# Patient Record
Sex: Female | Born: 1975 | Race: White | Hispanic: No | Marital: Married | Smoking: Never smoker
Health system: Southern US, Community
[De-identification: ages and names within clinical notes are randomized; demographics above are authoritative.]

## PROBLEM LIST (undated history)

## (undated) DIAGNOSIS — F439 Reaction to severe stress, unspecified: Secondary | ICD-10-CM

## (undated) DIAGNOSIS — T7840XA Allergy, unspecified, initial encounter: Secondary | ICD-10-CM

## (undated) DIAGNOSIS — R0602 Shortness of breath: Secondary | ICD-10-CM

## (undated) DIAGNOSIS — E039 Hypothyroidism, unspecified: Secondary | ICD-10-CM

## (undated) DIAGNOSIS — R569 Unspecified convulsions: Secondary | ICD-10-CM

## (undated) DIAGNOSIS — E785 Hyperlipidemia, unspecified: Secondary | ICD-10-CM

## (undated) DIAGNOSIS — G40909 Epilepsy, unspecified, not intractable, without status epilepticus: Secondary | ICD-10-CM

## (undated) DIAGNOSIS — E663 Overweight: Secondary | ICD-10-CM

## (undated) HISTORY — DX: Epilepsy, unspecified, not intractable, without status epilepticus: G40.909

## (undated) HISTORY — PX: OTHER SURGICAL HISTORY: SHX169

## (undated) HISTORY — DX: Hyperlipidemia, unspecified: E78.5

## (undated) HISTORY — DX: Shortness of breath: R06.02

## (undated) HISTORY — DX: Reaction to severe stress, unspecified: F43.9

## (undated) HISTORY — DX: Overweight: E66.3

## (undated) HISTORY — PX: WRIST SURGERY: SHX841

## (undated) HISTORY — DX: Hypothyroidism, unspecified: E03.9

## (undated) HISTORY — PX: KNEE SURGERY: SHX244

## (undated) HISTORY — DX: Unspecified convulsions: R56.9

## (undated) HISTORY — DX: Allergy, unspecified, initial encounter: T78.40XA

---

## 2005-12-31 ENCOUNTER — Ambulatory Visit: Payer: Self-pay | Admitting: Cardiology

## 2007-11-15 ENCOUNTER — Encounter (INDEPENDENT_AMBULATORY_CARE_PROVIDER_SITE_OTHER): Payer: Self-pay | Admitting: Orthopedic Surgery

## 2007-11-15 ENCOUNTER — Ambulatory Visit (HOSPITAL_BASED_OUTPATIENT_CLINIC_OR_DEPARTMENT_OTHER): Admission: RE | Admit: 2007-11-15 | Discharge: 2007-11-15 | Payer: Self-pay | Admitting: Orthopedic Surgery

## 2009-12-23 ENCOUNTER — Encounter: Payer: Self-pay | Admitting: Cardiology

## 2010-07-01 ENCOUNTER — Encounter: Payer: Self-pay | Admitting: Cardiology

## 2010-07-01 DIAGNOSIS — E039 Hypothyroidism, unspecified: Secondary | ICD-10-CM | POA: Insufficient documentation

## 2010-07-01 DIAGNOSIS — E785 Hyperlipidemia, unspecified: Secondary | ICD-10-CM | POA: Insufficient documentation

## 2010-07-02 ENCOUNTER — Ambulatory Visit
Admission: RE | Admit: 2010-07-02 | Discharge: 2010-07-02 | Payer: Self-pay | Source: Home / Self Care | Attending: Cardiology | Admitting: Cardiology

## 2010-07-02 ENCOUNTER — Encounter: Payer: Self-pay | Admitting: Cardiology

## 2010-07-10 NOTE — Assessment & Plan Note (Signed)
Summary: EST LAST 2007 -   Visit Type:  Follow-up Primary Provider:  Allyson Sabal  CC:  palpitations and SOB.  History of Present Illness: The patient was seen for the evaluation of palpitations and shortness of breath.  I had seen her in 2007.  At that time I thought that her palpitations were very benign.  I decided not to do an echo or a Holter monitor.  She has been relatively stable.  In the past 3 years she's developed seizures that are being treated.  The patient recently has noted that when she becomes upset she has palpitations and significant shortness of breath.  She does not think that this is a panic attack.  She is here for further evaluation.  She has not had syncope or presyncope.  Preventive Screening-Counseling & Management  Alcohol-Tobacco     Smoking Status: never  Current Medications (verified): 1)  Depo-Provera 150 Mg/ml Susp (Medroxyprogesterone Acetate) .... Every Im 2)  Vimpat 150 Mg Tabs (Lacosamide) .... Take 1 Tablet By Mouth Two Times A Day 3)  Lamotrigine 100 Mg Tabs (Lamotrigine) .... Take 1 Tablet By Mouth Two Times A Day 4)  Keppra Xr 500 Mg Xr24h-Tab (Levetiracetam) .... Take 1 Tablet By Mouth Two Times A Day 5)  Cyanocobalamin 1000 Mcg/ml Soln (Cyanocobalamin) .... One Injection Monthly 6)  Levothyroxine Sodium 137 Mcg Tabs (Levothyroxine Sodium) .... Take 1 Tablet By Mouth Once A Day 7)  Vitamin D3 1000 Unit Caps (Cholecalciferol) .... Take 1 Tablet By Mouth Three Times A Day 8)  Fish Oil 1000 Mg Caps (Omega-3 Fatty Acids) .... Take 1 Tablet By Mouth Once A Day 9)  Aspirin 325 Mg Tabs (Aspirin) .... Take 1 Tablet By Mouth Once A Day 10)  Multivitamins  Tabs (Multiple Vitamin) .... Take 1 Tablet By Mouth Once A Day  Allergies (verified): No Known Drug Allergies  Comments:  Nurse/Medical Assistant: The patient's medication list and allergies were reviewed with the patient and were updated in the Medication and Allergy Lists.  Past  History:  Past Medical History: Hypothyroidism Anxiety Stress Overweight Palpiations Allergy to pencillin Dyslipidemia shortness of breath  Social History: Smoking Status:  never  Review of Systems       Patient denies fever, chills, headache, sweats, rash, change in vision, change in hearing, cough, nausea or vomiting, urinary symptoms.  All other systems are reviewed and are negative.  Vital Signs:  Patient profile:   35 year old female Height:      64 inches Weight:      234 pounds BMI:     40.31 Pulse rate:   78 / minute BP sitting:   116 / 82  (left arm) Cuff size:   large  Vitals Entered By: Carlye Grippe (July 02, 2010 9:52 AM)  Nutrition Counseling: Patient's BMI is greater than 25 and therefore counseled on weight management options. CC: palpitations, SOB   Physical Exam  General:  The patient is stable but overweight. Head:  head is atraumatic. Eyes:  no xanthelasma. Neck:  no jugular venous extension. Chest Wall:  no chest wall tenderness. Lungs:  lungs are clear.  Respiratory effort is not labored. Heart:  cardiac exam reveals S1 and S2.  There are no clicks or significant murmurs Abdomen:  abdomen is soft. Msk:  no musculoskeletal deformities. Extremities:  no peripheral edema. Skin:  no skin rashes. Psych:  patient is oriented to person time and place.  Affect is normal.   Impression & Recommendations:  Problem # 1:  SHORTNESS OF BREATH (ICD-786.05)  The patient has shortness of breath with palpitations under stressful situations.  There is no sign of heart failure.  I have no previous echo data.  It is now time to proceed with a 2-D echo to be sure that she has normal LV function.  EKG was done today and reviewed by me.  It is normal.  Orders: 2-D Echocardiogram (2D Echo)  Problem # 2:  CHEST PAIN (ICD-786.50) She's not having any significant chest pain at this time.  Problem # 3:  PALPITATIONS (ICD-785.1)  The patient is having more  palpitations than she had in the past.  She has not had syncope or presyncope.  She has never had a Holter monitor.  She says that she has this very frequently and that a two-day monitor would definitely catch some of her spells.  This will be arranged.  Orders: Holter Monitor (Holter Monitor)  Problem # 4:  OVERWEIGHT (ICD-278.02) The patient is is encouraged to try to lose some weight.  Problem # 5:  * ANXIETY / STRESS The patient is aware that some of her symptoms are related to her home stresses.  However with increasing symptoms she needs further evaluation.  Problem # 6:  HYPOTHYROIDISM (ICD-244.9) The patient is on thyroid replacement.  I have reviewed her labs.  TSH was normal in July, 2011.  Other Orders: EKG w/ Interpretation (93000)  Patient Instructions: 1)  We will contact you regarding your follow up appt with Dr. Myrtis Ser. 2)  Your physician has requested that you have an echocardiogram.  Echocardiography is a painless test that uses sound waves to create images of your heart. It provides your doctor with information about the size and shape of your heart and how well your heart's chambers and valves are working.  This procedure takes approximately one hour. There are no restrictions for this procedure. 3)  Your physician has recommended that you wear a holter monitor.  Holter monitors are medical devices that record the heart's electrical activity. Doctors most often use these monitors to diagnose arrhythmias. Arrhythmias are problems with the speed or rhythm of the heartbeat. The monitor is a small, portable device. You can wear one while you do your normal daily activities. This is usually used to diagnose what is causing palpitations/syncope (passing out). RETURN ON July 16, 2009 AT 8:30AM FOR HOLTER PLACEMENT.

## 2010-07-10 NOTE — Miscellaneous (Signed)
  Clinical Lists Changes  Problems: Removed problem of UNSPECIFIED VITAMIN D DEFICIENCY (ICD-268.9) Removed problem of OTHER CONVULSIONS (ICD-780.39) Removed problem of UNSPECIFIED TACHYCARDIA (ICD-785.0) Removed problem of UNSPECIFIED HYPOTHYROIDISM (ICD-244.9) Removed problem of HYPERLIPIDEMIA (ICD-272.4) Added new problem of HYPOTHYROIDISM (ICD-244.9) Added new problem of * ANXIETY / STRESS Added new problem of OVERWEIGHT (ICD-278.02) Added new problem of PALPITATIONS (ICD-785.1) Added new problem of DYSLIPIDEMIA (ICD-272.4) Observations: Added new observation of PAST MED HX: Hypothyroidism Anxiety Stress Overweight Palpiations Allergy to pencillin Dyslipidemia  (07/01/2010 17:18)       Past History:  Past Medical History: Hypothyroidism Anxiety Stress Overweight Palpiations Allergy to pencillin Dyslipidemia

## 2010-07-14 ENCOUNTER — Telehealth (INDEPENDENT_AMBULATORY_CARE_PROVIDER_SITE_OTHER): Payer: Self-pay | Admitting: *Deleted

## 2010-07-16 ENCOUNTER — Ambulatory Visit: Payer: Self-pay

## 2010-07-18 ENCOUNTER — Encounter: Payer: Self-pay | Admitting: Cardiology

## 2010-07-18 DIAGNOSIS — R0602 Shortness of breath: Secondary | ICD-10-CM

## 2010-07-24 ENCOUNTER — Telehealth (INDEPENDENT_AMBULATORY_CARE_PROVIDER_SITE_OTHER): Payer: Self-pay | Admitting: *Deleted

## 2010-07-24 NOTE — Progress Notes (Signed)
Summary: Cancel Holter  Phone Note Call from Patient Call back at [employer] Phone [phone]   Summary of Call: Pt called office to cancel appt for monitor. She does not want to wear Holter at this time. She wants to do Echo and keep f/u with Dr. Myrtis Ser on 2/17. Initial call taken by: Cyril Loosen, RN, BSN,  July 14, 2010 1:42 PM

## 2010-07-25 ENCOUNTER — Ambulatory Visit: Payer: Self-pay | Admitting: Cardiology

## 2010-07-30 NOTE — Progress Notes (Signed)
Summary: PATIENT NEEDING TEST RESULTS IF POSSIBLE BY PHONE  Phone Note Call from Patient Call back at Home Phone [phone]   Caller: Patient Reason for Call: Talk to Nurse Summary of Call: HAD TO RESCHD DUE TO HER SON MAYBE NEEDING SURGERY.  WOULD LIKE TO BE TOLD RESULTS OF HER TEST BY PHONE.  I DID RESCHD FOR HIS FIRST AVAILABLE WHICH WAS IN [facility] Initial call taken by: Claudette Laws,  July 24, 2010 8:37 AM  Follow-up for Phone Call        patient informed that once results are reviewed we would call her. Follow-up by: Carlye Grippe,  July 24, 2010 9:10 AM

## 2010-09-08 ENCOUNTER — Encounter: Payer: Self-pay | Admitting: *Deleted

## 2010-09-09 ENCOUNTER — Ambulatory Visit: Payer: Self-pay | Admitting: Cardiology

## 2010-10-21 NOTE — H&P (Signed)
Sherri Patel, Sherri Patel              ACCOUNT NO.:  [id number]   MEDICAL RECORD NO.:  [id number]          PATIENT TYPE:  AMB   LOCATION:  [facility]                          FACILITY:  [facility]   PHYSICIAN:  Katy Fitch. Sypher, M.D. DATE OF BIRTH:  1976/03/07   DATE OF ADMISSION:  11/15/2007  DATE OF DISCHARGE:                              HISTORY & PHYSICAL   PREOPERATIVE DIAGNOSIS:  Enlarging right dorsal ganglion.   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   OPERATION:  Resection of right dorsal ganglion.   OPERATING SURGEON:  Katy Fitch. Sypher, MD   ASSISTANT:  Marveen Reeks Dasnoit, PA-C   ANESTHESIA:  General by LMA.   SUPERVISING ANESTHESIOLOGIST:  Zenon Mayo, MD.   INDICATIONS:  Sherri Patel is a 35 year old woman referred for  evaluation and management of a mass in the dorsal aspect of the right  wrist.  Clinical examination revealed signs of typical dorsal ganglion  presenting between the fourth and third dorsal compartments.   She requested resection.  After informed consent, she was brought to the  operating at this time.   Preoperatively, she was carefully advised that the ganglion pathologic  process is poorly understood.  She understands there is a significant  chance of recurrence over time.  She understands we cannot obtain a wide  margin with resection of the scapholunate interosseous ligament,  therefore, at times this degenerative process can recur.   After informed consent, she is brought to the operating at this time.   PROCEDURE:  Sherri Patel is brought to the operating room and placed  in supine position on the operating table.   Following an anesthesia consult with Dr. Sampson Goon, general anesthesia  was declined and IV regional accepted.   An IV was started on the dorsal aspect of the right hand and a standard  IV regional block was placed with a tourniquet on the proximal  brachium  at 300 mmHg.  After 10 minutes, excellent anesthesia was achieved.  The  arm was prepped with Betadine soap solution and sterilely draped.  Following confirmation of complete anesthesia, a short transverse  incision was fashioned directly over the palpable mass.  Subcutaneous  tissues were carefully divided down to the level of the extensor  retinaculum.  The mass was presenting distal to the retinaculum.  It was  circumferentially dissected taking care to protect the dorsal carpal  arch.  The cyst had a neck followed directly to the scapholunate  interosseous ligament.  A small capsulotomy was performed and the cyst  was resected directly off the scapholunate interosseous ligament.   A micro rongeur was used to debride the ligament.  Superficial fibers  and electrocautery used to obtain hemostasis in the capsular vessels.   Thereafter, the wound was explored, no other masses or particulates were  noted.  The wound was then repaired with subcutaneous suture of 4-0  Vicryl and intradermal 2-0 Prolene with Steri-Strips.   Lidocaine 2% was infiltrated postop analgesia followed by dressing of  the wound with Steri-Strip and compressive dressing.  A volar plaster  splint was applied maintaining the wrist in 5 degrees of dorsiflexion  for postoperative comfort.      Katy Fitch Sypher, M.D.  Electronically Signed     RVS/MEDQ  D:  11/15/2007  T:  11/16/2007  Job:  [id number]   cc:   [facility]

## 2010-10-24 NOTE — Assessment & Plan Note (Signed)
[facility]                            [facility] OFFICE NOTE   NAME:Sherri Patel                     MRN:          [id number]  DATE:12/31/2005                            DOB:          December 19, 1975    [name] is a very pleasant 35 year old who has had some palpitations.  She does admit that she has had increased stress over time.  She does not  drink a lot of caffeine.  She has had no syncope or presyncope.  The  palpitations last for only a few seconds.  She has not had any chest pain or  major shortness of breath.  She is here for further evaluation.   ALLERGIES:  PENICILLIN.   MEDICATIONS:  1.  Levoxyl 137 mcg daily.  2.  Birth control pill.   OTHER MEDICAL PROBLEMS:  See the complete list below.   SOCIAL HISTORY:  The patient does not smoke or drink.  She has three  children and she is a part time Consulting civil engineer.   FAMILY HISTORY:  There is no strong family history of coronary disease.   REVIEW OF SYSTEMS:  The patient's review of systems is negative other than  having some increased stress.  Otherwise, review of systems is negative.   PHYSICAL EXAMINATION:  GENERAL APPEARANCE:  Patient is oriented to person,  time and place and her affect is normal.  VITAL SIGNS:  Blood pressure is 104/62, pulse is 76.  HEENT:  No xanthelasma.  She has normal extraocular motion.  NECK:  There are no carotid bruits.  LUNGS:  Clear.  Respiratory effort is not labored.  CARDIOVASCULAR:  An S1 with an S2.  There are no clicks or significant  murmurs.  ABDOMEN:  Soft.  There are no masses or bruits.  EXTREMITIES:  She has no peripheral edema.  The patient is overweight.   EKG is normal.   PROBLEM LIST:  1.  Hypothyroidism on medication.  She tells me that her labs have been      checked recently.  2.  Increased stress recently.  3.  Overweight.  4.  Allergy to penicillin.  5.  Palpitations.   I believe that the patient has no evidence of  significant cardiac disease.  I considered a Holter and decided it was not needed.  I also considered 2-D  echo.  At this point, I do not feel that this is needed either.  The patient  is reassured.  We will not do any further work-up unless she has continued  problems.  If so, we can evaluate further.                                   Luis Abed, MD, [facility]   JDK/MedQ  DD:  12/31/2005  DT:  12/31/2005  Job #:  [id number]   cc:   Samuel Jester

## 2015-01-01 ENCOUNTER — Telehealth: Payer: Self-pay | Admitting: Family Medicine

## 2015-01-02 NOTE — Telephone Encounter (Signed)
Pt given new pt appt with Dr.dettinger 02/15/15 at 8:55. Pt aware to arrive 30 minutes prior with a valid photo ID, list of current medications, copy of records and to make sure we are on her Medicaid card.

## 2015-02-14 ENCOUNTER — Ambulatory Visit: Payer: Self-pay | Admitting: Family Medicine

## 2015-02-15 ENCOUNTER — Ambulatory Visit: Payer: Self-pay | Admitting: Family Medicine

## 2015-02-28 LAB — HM PAP SMEAR: HM PAP: NEGATIVE

## 2015-03-20 ENCOUNTER — Ambulatory Visit: Payer: Self-pay | Admitting: Family Medicine

## 2015-03-28 ENCOUNTER — Ambulatory Visit (INDEPENDENT_AMBULATORY_CARE_PROVIDER_SITE_OTHER): Payer: BLUE CROSS/BLUE SHIELD | Admitting: Family Medicine

## 2015-03-28 ENCOUNTER — Encounter: Payer: Self-pay | Admitting: Family Medicine

## 2015-03-28 VITALS — BP 127/84 | HR 76 | Temp 98.3°F | Ht 64.0 in | Wt 247.0 lb

## 2015-03-28 DIAGNOSIS — G40109 Localization-related (focal) (partial) symptomatic epilepsy and epileptic syndromes with simple partial seizures, not intractable, without status epilepticus: Secondary | ICD-10-CM | POA: Diagnosis not present

## 2015-03-28 DIAGNOSIS — R896 Abnormal cytological findings in specimens from other organs, systems and tissues: Secondary | ICD-10-CM

## 2015-03-28 DIAGNOSIS — IMO0002 Reserved for concepts with insufficient information to code with codable children: Secondary | ICD-10-CM | POA: Insufficient documentation

## 2015-03-28 DIAGNOSIS — G40909 Epilepsy, unspecified, not intractable, without status epilepticus: Secondary | ICD-10-CM | POA: Insufficient documentation

## 2015-03-28 NOTE — Patient Instructions (Addendum)
Great to see you again!  Let us know if we can help, otherwise plan to come back a month or so before your thyroid medicine is due  Be sure to stay on top of your annual pap smear.   Try to develop a habit of exercise. Shoot for 30 minutes a day 3-4 times per week.

## 2015-03-28 NOTE — Progress Notes (Signed)
   HPI  Patient presents today here to establish care.  Patient has a long history of hypothyroidism treated easily with 150 g of Synthroid. She has a seizure disorder treated with Lamictal and Keppra by her neurologist who is Dr. Izola PriceMyers at [facility] [place]  She states that last year she had ASCUS with negative HPV, she's due for another Pap smear in about 10 months., Approximately August 2017  She denies any concerns or problems today. She disliked establish care.  PMH: Smoking status noted Her past medical, social, family, and surgical history reviewed and updated in EMR ROS: Per HPI  Objective: BP 127/84 mmHg  Pulse 76  Temp(Src) 98.3 F (36.8 C) (Oral)  Ht 5\' 4"  (1.626 m)  Wt 247 lb (112.038 kg)  BMI 42.38 kg/m2 Gen: NAD, alert, cooperative with exam HEENT: NCAT, oropharynx clear Neck: No thyromegaly CV: RRR, good S1/S2, no murmur Resp: CTABL, no wheezes, non-labored Abd: SNTND, BS present, no guarding or organomegaly Ext: No edema, warm Neuro: Alert and oriented, No gross deficits  Assessment and plan:  # Hypothyroidism Continue current dose of Synthroid, labs not due for one more year Requested labs be transferred here  # Epilepsy Treatment per neurology, she is on a very good regimen of Keppra and Lamictal  # ASCUS with negative HPV Reported from her history of a recent Pap smear with negative HPV, she is nervous about this as her mother had cervical cancer Recommended 1 year repeat Pap smear just as she has already planned  Healthcare maintenance She declines flu shot today She's recently had labs which she will be transferred here  Murtis SinkSam Hydee Fleece, MD [facility] 03/28/2015, 1:46 PM

## 2015-08-19 ENCOUNTER — Encounter: Payer: Self-pay | Admitting: *Deleted

## 2015-10-18 DIAGNOSIS — Z3042 Encounter for surveillance of injectable contraceptive: Secondary | ICD-10-CM | POA: Diagnosis not present

## 2016-01-03 DIAGNOSIS — Z3042 Encounter for surveillance of injectable contraceptive: Secondary | ICD-10-CM | POA: Diagnosis not present

## 2016-02-24 ENCOUNTER — Telehealth: Payer: Self-pay

## 2016-02-24 ENCOUNTER — Telehealth: Payer: Self-pay | Admitting: Family Medicine

## 2016-02-24 ENCOUNTER — Other Ambulatory Visit: Payer: Self-pay

## 2016-02-24 MED ORDER — LEVOTHYROXINE SODIUM 137 MCG PO TABS
137.0000 ug | ORAL_TABLET | Freq: Every day | ORAL | 0 refills | Status: DC
Start: 2016-02-24 — End: 2016-03-02

## 2016-02-24 MED ORDER — LEVOTHYROXINE SODIUM 137 MCG PO TABS: 137.0000 ug | ORAL_TABLET | Freq: Every day | ORAL | 2 refills | Status: DC

## 2016-02-24 NOTE — Telephone Encounter (Signed)
Refilled.   Murtis SinkSam Shanikwa State, MD [facility] 02/24/2016, 5:00 PM

## 2016-02-24 NOTE — Telephone Encounter (Signed)
Requesting refill on levothyroxine. Patient made apt Friday at 9:25 but will be out for two days. Please advise

## 2016-02-24 NOTE — Telephone Encounter (Signed)
Addressed in seperate phone note.   Murtis SinkSam Bradshaw, MD [facility] 02/24/2016, 5:01 PM

## 2016-02-25 NOTE — Telephone Encounter (Signed)
Patient aware that refill has been sent to pharmacy. 

## 2016-02-28 ENCOUNTER — Encounter: Payer: Self-pay | Admitting: Family Medicine

## 2016-02-28 ENCOUNTER — Ambulatory Visit (INDEPENDENT_AMBULATORY_CARE_PROVIDER_SITE_OTHER): Payer: BLUE CROSS/BLUE SHIELD | Admitting: Family Medicine

## 2016-02-28 VITALS — BP 122/80 | HR 70 | Temp 97.9°F | Ht 64.0 in | Wt 248.6 lb

## 2016-02-28 DIAGNOSIS — E039 Hypothyroidism, unspecified: Secondary | ICD-10-CM | POA: Diagnosis not present

## 2016-02-28 DIAGNOSIS — E538 Deficiency of other specified B group vitamins: Secondary | ICD-10-CM

## 2016-02-28 DIAGNOSIS — E559 Vitamin D deficiency, unspecified: Secondary | ICD-10-CM

## 2016-02-28 NOTE — Progress Notes (Signed)
   HPI  Patient presents today here for follow-up of hypothyroidism, vitamin B-12 deficiency, vitamin D deficiency.  Hypothyroidism Long-standing, stable dose for over a year Needs labs today, refilled last week for 2 months.  Vitamin D deficiency History vitamin D deficiency, not replacing currently.  Vitamin B 12 deficiency Has been taking oral B-12 for about a week, otherwise it has not been replaced for over a year. Previously taking injections.  PMH: Smoking status noted ROS: Per HPI  Objective: BP 122/80   Pulse 70   Temp 97.9 F (36.6 C) (Oral)   Ht '5\' 4"'$  (1.626 m)   Wt 248 lb 9.6 oz (112.8 kg)   BMI 42.67 kg/m  Gen: NAD, alert, cooperative with exam HEENT: NCAT CV: RRR, good S1/S2, no murmur Resp: CTABL, no wheezes, non-labored  Ext: No edema, warm Neuro: Alert and oriented, No gross deficits  Assessment and plan:  # Hypothyroidism Rechecking labs, continue current Synthroid dose until labs are returned.   # Vitamin B-12 deficiency Rechecking labs, likely needs replacement  # Vitamin D deficiency Rechecking labs, replace as needed  # Morbid obesity Recommended therapeutic lifestyle changes   Orders Placed This Encounter  Procedures  . CMP14+EGFR  . CBC with Differential  . Lipid panel  . TSH  . Vitamin B12  . VITAMIN D 25 Hydroxy (Vit-D Deficiency, Fractures)    Laroy Apple, MD [facility] 02/28/2016, 10:09 AM

## 2016-02-28 NOTE — Patient Instructions (Signed)
Great to see you!  Try to incorporate 100 minutes of regular arerobic exercise b=per week.   Lets follow up in 1 year unless needed sooner.

## 2016-02-29 LAB — VITAMIN B12: Vitamin B-12: 316 pg/mL (ref 211–946)

## 2016-02-29 LAB — CMP14+EGFR
ALT: 27 IU/L (ref 0–32)
AST: 23 IU/L (ref 0–40)
Albumin/Globulin Ratio: 1.8 (ref 1.2–2.2)
Albumin: 4.1 g/dL (ref 3.5–5.5)
Alkaline Phosphatase: 86 IU/L (ref 39–117)
BILIRUBIN TOTAL: 0.4 mg/dL (ref 0.0–1.2)
BUN/Creatinine Ratio: 11 (ref 9–23)
BUN: 10 mg/dL (ref 6–24)
CALCIUM: 9 mg/dL (ref 8.7–10.2)
CHLORIDE: 103 mmol/L (ref 96–106)
CO2: 20 mmol/L (ref 18–29)
Creatinine, Ser: 0.88 mg/dL (ref 0.57–1.00)
GFR calc non Af Amer: 82 mL/min/{1.73_m2} (ref >59)
GFR, EST AFRICAN AMERICAN: 95 mL/min/{1.73_m2} (ref >59)
GLUCOSE: 87 mg/dL (ref 65–99)
Globulin, Total: 2.3 g/dL (ref 1.5–4.5)
Potassium: 4.3 mmol/L (ref 3.5–5.2)
Sodium: 141 mmol/L (ref 134–144)
TOTAL PROTEIN: 6.4 g/dL (ref 6.0–8.5)

## 2016-02-29 LAB — CBC WITH DIFFERENTIAL/PLATELET
BASOS ABS: 0 10*3/uL (ref 0.0–0.2)
Basos: 0 %
EOS (ABSOLUTE): 0.3 10*3/uL (ref 0.0–0.4)
Eos: 4 %
HEMOGLOBIN: 13.5 g/dL (ref 11.1–15.9)
Hematocrit: 40.4 % (ref 34.0–46.6)
IMMATURE GRANS (ABS): 0.1 10*3/uL (ref 0.0–0.1)
IMMATURE GRANULOCYTES: 1 %
LYMPHS: 38 %
Lymphocytes Absolute: 2.7 10*3/uL (ref 0.7–3.1)
MCH: 28.7 pg (ref 26.6–33.0)
MCHC: 33.4 g/dL (ref 31.5–35.7)
MCV: 86 fL (ref 79–97)
MONOCYTES: 8 %
Monocytes Absolute: 0.6 10*3/uL (ref 0.1–0.9)
NEUTROS ABS: 3.6 10*3/uL (ref 1.4–7.0)
NEUTROS PCT: 49 %
PLATELETS: 300 10*3/uL (ref 150–379)
RBC: 4.71 x10E6/uL (ref 3.77–5.28)
RDW: 13 % (ref 12.3–15.4)
WBC: 7.1 10*3/uL (ref 3.4–10.8)

## 2016-02-29 LAB — LIPID PANEL
CHOLESTEROL TOTAL: 167 mg/dL (ref 100–199)
Chol/HDL Ratio: 3 ratio units (ref 0.0–4.4)
HDL: 55 mg/dL (ref >39)
LDL CALC: 95 mg/dL (ref 0–99)
TRIGLYCERIDES: 83 mg/dL (ref 0–149)
VLDL CHOLESTEROL CAL: 17 mg/dL (ref 5–40)

## 2016-02-29 LAB — TSH: TSH: 1.57 u[IU]/mL (ref 0.450–4.500)

## 2016-02-29 LAB — VITAMIN D 25 HYDROXY (VIT D DEFICIENCY, FRACTURES): Vit D, 25-Hydroxy: 29.4 ng/mL — ABNORMAL LOW (ref 30.0–100.0)

## 2016-03-02 ENCOUNTER — Telehealth: Payer: Self-pay | Admitting: *Deleted

## 2016-03-02 MED ORDER — LEVOTHYROXINE SODIUM 150 MCG PO TABS: 150.0000 ug | ORAL_TABLET | Freq: Every day | ORAL | 1 refills | Status: DC

## 2016-03-02 NOTE — Telephone Encounter (Signed)
Patient called and states that she has been on levothyroxine 150. Rx changed and sent new prescription to pharmacy.

## 2016-03-25 DIAGNOSIS — R875 Abnormal microbiological findings in specimens from female genital organs: Secondary | ICD-10-CM | POA: Diagnosis not present

## 2016-03-25 DIAGNOSIS — Z124 Encounter for screening for malignant neoplasm of cervix: Secondary | ICD-10-CM | POA: Diagnosis not present

## 2016-03-25 DIAGNOSIS — Z3042 Encounter for surveillance of injectable contraceptive: Secondary | ICD-10-CM | POA: Diagnosis not present

## 2016-03-25 DIAGNOSIS — Z01419 Encounter for gynecological examination (general) (routine) without abnormal findings: Secondary | ICD-10-CM | POA: Diagnosis not present

## 2016-06-10 DIAGNOSIS — Z3042 Encounter for surveillance of injectable contraceptive: Secondary | ICD-10-CM | POA: Diagnosis not present

## 2016-08-28 DIAGNOSIS — Z3042 Encounter for surveillance of injectable contraceptive: Secondary | ICD-10-CM | POA: Diagnosis not present

## 2016-09-15 ENCOUNTER — Other Ambulatory Visit: Payer: Self-pay | Admitting: Family Medicine

## 2016-11-16 DIAGNOSIS — Z3009 Encounter for other general counseling and advice on contraception: Secondary | ICD-10-CM | POA: Diagnosis not present

## 2016-12-18 ENCOUNTER — Other Ambulatory Visit: Payer: Self-pay | Admitting: Family Medicine

## 2016-12-21 ENCOUNTER — Telehealth: Payer: Self-pay | Admitting: Family Medicine

## 2016-12-21 NOTE — Telephone Encounter (Signed)
Note below  From nursing pool.  Medication Management (pharmacy is changing brands for levothyroxin, they are going [facility] to [facility] and [facility] it needs to be noted in chart by law  Murtis SinkSam Bradshaw, MD [facility] 12/21/2016, 11:58 AM

## 2017-02-01 DIAGNOSIS — Z3049 Encounter for surveillance of other contraceptives: Secondary | ICD-10-CM | POA: Diagnosis not present

## 2017-03-17 ENCOUNTER — Other Ambulatory Visit: Payer: Self-pay | Admitting: Family Medicine

## 2017-03-18 NOTE — Telephone Encounter (Signed)
Patient aware that she needs an appointment to be seen

## 2017-04-20 DIAGNOSIS — Z3042 Encounter for surveillance of injectable contraceptive: Secondary | ICD-10-CM | POA: Diagnosis not present

## 2017-06-25 ENCOUNTER — Ambulatory Visit: Payer: BLUE CROSS/BLUE SHIELD | Admitting: Family Medicine

## 2017-06-25 ENCOUNTER — Encounter: Payer: Self-pay | Admitting: Family Medicine

## 2017-06-25 VITALS — BP 121/82 | HR 76 | Temp 99.2°F | Ht 64.0 in | Wt 257.6 lb

## 2017-06-25 DIAGNOSIS — E039 Hypothyroidism, unspecified: Secondary | ICD-10-CM | POA: Diagnosis not present

## 2017-06-25 DIAGNOSIS — J0110 Acute frontal sinusitis, unspecified: Secondary | ICD-10-CM

## 2017-06-25 DIAGNOSIS — E559 Vitamin D deficiency, unspecified: Secondary | ICD-10-CM

## 2017-06-25 DIAGNOSIS — Z0000 Encounter for general adult medical examination without abnormal findings: Secondary | ICD-10-CM | POA: Diagnosis not present

## 2017-06-25 MED ORDER — DOXYCYCLINE HYCLATE 100 MG PO TABS: 100.0000 mg | ORAL_TABLET | Freq: Two times a day (BID) | ORAL | 0 refills | Status: DC

## 2017-06-25 MED ORDER — LEVOTHYROXINE SODIUM 150 MCG PO TABS: 150.0000 ug | ORAL_TABLET | Freq: Every day | ORAL | 3 refills | Status: DC

## 2017-06-25 NOTE — Patient Instructions (Signed)
Great to see you!   

## 2017-06-25 NOTE — Progress Notes (Signed)
   HPI  Patient presents today a lot for chronic medical conditions.  Hypothyroidism Patient's gained 10 pounds in the last 2 months, she also feels rundown and tired. She would like her labs rechecked.  Sinuses Patient with sinus congestion and frontal sinus pain and pressure for a few weeks. Mild shortness of breath and cough, states she has history of asthma during pregnancy with similar symptoms.    PMH: Smoking status noted ROS: Per HPI  Objective: BP 121/82   Pulse 76   Temp 99.2 F (37.3 C) (Oral)   Ht '5\' 4"'$  (1.626 m)   Wt 257 lb 9.6 oz (116.8 kg)   SpO2 99%   BMI 44.22 kg/m  Gen: NAD, alert, cooperative with exam HEENT: NCAT, EOMI, PERRL CV: RRR, good S1/S2, no murmur Resp: CTABL, no wheezes, non-labored Ext: No edema, warm Neuro: Alert and oriented, No gross deficits  Assessment and plan:  #Annual exam. Normal exam except for weight [facility]. Discussed birth control.  She was stopped from OCPs due to seizures.   #Hypothyroidism Labs She does have symptoms of fatigue, weight gain  # vitamin D deficiency Labs  #Subacute frontal sinusitis Patient with symptoms for several weeks they have been waxing waning, given her shortness of breath and other symptoms I have tried a course of doxycycline for 2 weeks She states albuterol has caused seizures in the past so she does not want to try this, could consider inhaled corticosteroid    Orders Placed This Encounter  Procedures  . CMP14+EGFR  . CBC with Differential/Platelet  . Lipid panel  . TSH  . VITAMIN D 25 Hydroxy (Vit-D Deficiency, Fractures)    Meds ordered this encounter  Medications  . levothyroxine (SYNTHROID, LEVOTHROID) 150 MCG tablet    Sig: Take 1 tablet (150 mcg total) by mouth daily.    Dispense:  90 tablet    Refill:  3  . doxycycline (VIBRA-TABS) 100 MG tablet    Sig: Take 1 tablet (100 mg total) by mouth 2 (two) times daily. 1 po bid   Dispense:  28 tablet    Refill:  0    Laroy Apple, MD [facility] 06/25/2017, 4:35 PM

## 2017-06-26 LAB — CMP14+EGFR
ALT: 29 IU/L (ref 0–32)
AST: 18 IU/L (ref 0–40)
Albumin/Globulin Ratio: 1.9 (ref 1.2–2.2)
Albumin: 4.6 g/dL (ref 3.5–5.5)
Alkaline Phosphatase: 94 IU/L (ref 39–117)
BUN / CREAT RATIO: 10 (ref 9–23)
BUN: 9 mg/dL (ref 6–24)
Bilirubin Total: 0.4 mg/dL (ref 0.0–1.2)
CALCIUM: 9.5 mg/dL (ref 8.7–10.2)
CO2: 19 mmol/L — AB (ref 20–29)
CREATININE: 0.87 mg/dL (ref 0.57–1.00)
Chloride: 105 mmol/L (ref 96–106)
GFR, EST AFRICAN AMERICAN: 96 mL/min/{1.73_m2} (ref >59)
GFR, EST NON AFRICAN AMERICAN: 83 mL/min/{1.73_m2} (ref >59)
GLUCOSE: 91 mg/dL (ref 65–99)
Globulin, Total: 2.4 g/dL (ref 1.5–4.5)
Potassium: 4.3 mmol/L (ref 3.5–5.2)
Sodium: 141 mmol/L (ref 134–144)
TOTAL PROTEIN: 7 g/dL (ref 6.0–8.5)

## 2017-06-26 LAB — CBC WITH DIFFERENTIAL/PLATELET
BASOS ABS: 0 10*3/uL (ref 0.0–0.2)
Basos: 0 %
EOS (ABSOLUTE): 0.1 10*3/uL (ref 0.0–0.4)
Eos: 1 %
Hematocrit: 42.7 % (ref 34.0–46.6)
Hemoglobin: 13.9 g/dL (ref 11.1–15.9)
IMMATURE GRANS (ABS): 0.1 10*3/uL (ref 0.0–0.1)
Immature Granulocytes: 1 %
LYMPHS: 49 %
Lymphocytes Absolute: 4.3 10*3/uL — ABNORMAL HIGH (ref 0.7–3.1)
MCH: 28.7 pg (ref 26.6–33.0)
MCHC: 32.6 g/dL (ref 31.5–35.7)
MCV: 88 fL (ref 79–97)
Monocytes Absolute: 0.9 10*3/uL (ref 0.1–0.9)
Monocytes: 10 %
NEUTROS ABS: 3.4 10*3/uL (ref 1.4–7.0)
Neutrophils: 39 %
PLATELETS: 307 10*3/uL (ref 150–379)
RBC: 4.84 x10E6/uL (ref 3.77–5.28)
RDW: 13.6 % (ref 12.3–15.4)
WBC: 8.7 10*3/uL (ref 3.4–10.8)

## 2017-06-26 LAB — LIPID PANEL
Chol/HDL Ratio: 3.1 ratio (ref 0.0–4.4)
Cholesterol, Total: 178 mg/dL (ref 100–199)
HDL: 58 mg/dL (ref >39)
LDL Calculated: 104 mg/dL — ABNORMAL HIGH (ref 0–99)
Triglycerides: 80 mg/dL (ref 0–149)
VLDL Cholesterol Cal: 16 mg/dL (ref 5–40)

## 2017-06-26 LAB — TSH: TSH: 1.5 u[IU]/mL (ref 0.450–4.500)

## 2017-06-26 LAB — VITAMIN D 25 HYDROXY (VIT D DEFICIENCY, FRACTURES): VIT D 25 HYDROXY: 33 ng/mL (ref 30.0–100.0)

## 2017-06-29 ENCOUNTER — Telehealth: Payer: Self-pay | Admitting: Family Medicine

## 2017-06-29 MED ORDER — LEVOFLOXACIN 500 MG PO TABS: 500.0000 mg | ORAL_TABLET | Freq: Every day | ORAL | 0 refills | Status: DC

## 2017-06-29 NOTE — Telephone Encounter (Signed)
lmtcb

## 2017-06-29 NOTE — Telephone Encounter (Signed)
Ok to [employer] doxy, I have sent levaquin as an alternative.   Sherri SinkSam Domingue Coltrain, MD [facility] 06/29/2017, 12:30 PM

## 2017-07-06 DIAGNOSIS — Z3009 Encounter for other general counseling and advice on contraception: Secondary | ICD-10-CM | POA: Diagnosis not present

## 2017-07-06 DIAGNOSIS — Z3042 Encounter for surveillance of injectable contraceptive: Secondary | ICD-10-CM | POA: Diagnosis not present

## 2017-07-23 ENCOUNTER — Other Ambulatory Visit: Payer: BLUE CROSS/BLUE SHIELD

## 2017-07-23 DIAGNOSIS — E878 Other disorders of electrolyte and fluid balance, not elsewhere classified: Secondary | ICD-10-CM | POA: Diagnosis not present

## 2017-07-24 LAB — BMP8+EGFR
BUN / CREAT RATIO: 14 (ref 9–23)
BUN: 11 mg/dL (ref 6–24)
CHLORIDE: 106 mmol/L (ref 96–106)
CO2: 21 mmol/L (ref 20–29)
Calcium: 9.2 mg/dL (ref 8.7–10.2)
Creatinine, Ser: 0.78 mg/dL (ref 0.57–1.00)
GFR, EST AFRICAN AMERICAN: 109 mL/min/{1.73_m2} (ref >59)
GFR, EST NON AFRICAN AMERICAN: 95 mL/min/{1.73_m2} (ref >59)
Glucose: 90 mg/dL (ref 65–99)
POTASSIUM: 4.4 mmol/L (ref 3.5–5.2)
Sodium: 142 mmol/L (ref 134–144)

## 2017-08-16 DIAGNOSIS — G43719 Chronic migraine without aura, intractable, without status migrainosus: Secondary | ICD-10-CM | POA: Diagnosis not present

## 2017-08-16 DIAGNOSIS — Z79899 Other long term (current) drug therapy: Secondary | ICD-10-CM | POA: Diagnosis not present

## 2017-08-16 DIAGNOSIS — G40209 Localization-related (focal) (partial) symptomatic epilepsy and epileptic syndromes with complex partial seizures, not intractable, without status epilepticus: Secondary | ICD-10-CM | POA: Diagnosis not present

## 2017-08-16 DIAGNOSIS — Z5181 Encounter for therapeutic drug level monitoring: Secondary | ICD-10-CM | POA: Diagnosis not present

## 2017-09-08 ENCOUNTER — Other Ambulatory Visit: Payer: Self-pay | Admitting: Physician Assistant

## 2017-09-08 ENCOUNTER — Encounter: Payer: Self-pay | Admitting: Family Medicine

## 2017-09-08 MED ORDER — AZITHROMYCIN 250 MG PO TABS: ORAL_TABLET | ORAL | 0 refills | Status: DC

## 2017-09-10 ENCOUNTER — Ambulatory Visit: Payer: BLUE CROSS/BLUE SHIELD | Admitting: Family Medicine

## 2017-09-10 ENCOUNTER — Encounter: Payer: Self-pay | Admitting: Family Medicine

## 2017-09-10 VITALS — BP 124/82 | HR 84 | Temp 99.0°F | Ht 64.0 in | Wt 258.8 lb

## 2017-09-10 DIAGNOSIS — J0100 Acute maxillary sinusitis, unspecified: Secondary | ICD-10-CM

## 2017-09-10 DIAGNOSIS — H66002 Acute suppurative otitis media without spontaneous rupture of ear drum, left ear: Secondary | ICD-10-CM

## 2017-09-10 MED ORDER — CEFDINIR 300 MG PO CAPS: 300.0000 mg | ORAL_CAPSULE | Freq: Two times a day (BID) | ORAL | 0 refills | Status: DC

## 2017-09-10 MED ORDER — METHYLPREDNISOLONE ACETATE 80 MG/ML IJ SUSP
80.0000 mg | Freq: Once | INTRAMUSCULAR | Status: AC
  Administered 2017-09-10: 80 mg via INTRAMUSCULAR

## 2017-09-10 NOTE — Patient Instructions (Signed)
Great to see you!   Sinusitis, Adult Sinusitis is soreness and inflammation of your sinuses. Sinuses are hollow spaces in the bones around your face. They are located:  Around your eyes.  In the middle of your forehead.  Behind your nose.  In your cheekbones.  Your sinuses and nasal passages are lined with a stringy fluid (mucus). Mucus normally drains out of your sinuses. When your nasal tissues get inflamed or swollen, the mucus can get trapped or blocked so air cannot flow through your sinuses. This lets bacteria, viruses, and funguses grow, and that leads to infection. Follow these instructions at home: Medicines  Take, use, or apply over-the-counter and prescription medicines only as told by your doctor. These may include nasal sprays.  If you were prescribed an antibiotic medicine, take it as told by your doctor. Do not stop taking the antibiotic even if you start to feel better. Hydrate and Humidify  Drink enough water to keep your pee (urine) clear or pale yellow.  Use a cool mist humidifier to keep the humidity level in your home above 50%.  Breathe in steam for 10-15 minutes, 3-4 times a day or as told by your doctor. You can do this in the bathroom while a hot shower is running.  Try not to spend time in cool or dry air. Rest  Rest as much as possible.  Sleep with your head raised (elevated).  Make sure to get enough sleep each night. General instructions  Put a warm, moist washcloth on your face 3-4 times a day or as told by your doctor. This will help with discomfort.  Wash your hands often with soap and water. If there is no soap and water, use hand sanitizer.  Do not smoke. Avoid being around people who are smoking (secondhand smoke).  Keep all follow-up visits as told by your doctor. This is important. Contact a doctor if:  You have a fever.  Your symptoms get worse.  Your symptoms do not get better within 10 days. Get help right away if:  You  have a very bad headache.  You cannot stop throwing up (vomiting).  You have pain or swelling around your face or eyes.  You have trouble seeing.  You feel confused.  Your neck is stiff.  You have trouble breathing. This information is not intended to replace advice given to you by your health care provider. Make sure you discuss any questions you have with your health care provider. Document Released: 11/11/2007 Document Revised: 01/19/2016 Document Reviewed: 03/20/2015 Elsevier Interactive Patient Education  2018 [employer]  

## 2017-09-10 NOTE — Progress Notes (Signed)
   HPI  Patient presents today with ear pain, cough, and sinus pain.  Patient explains she has been ill for about 6 days, she has had cough productive of brown sputum.  She is also had initially right-sided ear pain then left-sided ear pain. She states that for 2 nights in a row she has had bleeding from her right ear, her left ear has drained pus externally.  She also has left-sided maxillary facial pain and pressure. She is tolerating food and fluids like usual. She started azithromycin which was sent over the phone, however she did not have significant improvement.  She is on day 3 of this medication.  PMH: Smoking status noted ROS: Per HPI  Objective: BP 124/82   Pulse 84   Temp 99 F (37.2 C) (Oral)   Ht 5\' 4"  (1.626 m)   Wt 258 lb 12.8 oz (117.4 kg)   SpO2 96%   BMI 44.42 kg/m  Gen: NAD, alert, cooperative with exam HEENT: NCAT, right TM with straw-colored effusion, no defect of the TM visible, left TM with purulence behind the TM and erythema with loss of landmarks, tenderness to palpation over left-sided maxillary sinus CV: RRR, good S1/S2, no murmur Resp: CTABL, no wheezes, non-labored Ext: No edema, warm Neuro: Alert and oriented, No gross deficits  Assessment and plan:  #Acute maxillary sinusitis With otitis media Continue azithromycin, adding Omnicef, her penicillin allergy is upset stomach, no history of hives or anaphylaxis. Also given IM Depo-Medrol   Meds ordered this encounter  Medications  . cefdinir (OMNICEF) 300 MG capsule    Sig: Take 1 capsule (300 mg total) by mouth 2 (two) times daily. 1 po BID    Dispense:  20 capsule    Refill:  0  . methylPREDNISolone acetate (DEPO-MEDROL) injection 80 mg    Murtis SinkSam Kalem Rockwell, MD [facility] 09/10/2017, 2:14 PM   '

## 2017-09-12 ENCOUNTER — Encounter: Payer: Self-pay | Admitting: Family Medicine

## 2017-09-13 MED ORDER — LEVOFLOXACIN 750 MG PO TABS: 750.0000 mg | ORAL_TABLET | Freq: Every day | ORAL | 0 refills | Status: DC

## 2017-09-13 MED ORDER — CIPROFLOXACIN-DEXAMETHASONE 0.3-0.1 % OT SUSP: 4.0000 [drp] | Freq: Two times a day (BID) | OTIC | 0 refills | Status: DC

## 2017-09-21 DIAGNOSIS — Z3042 Encounter for surveillance of injectable contraceptive: Secondary | ICD-10-CM | POA: Diagnosis not present

## 2017-09-27 ENCOUNTER — Encounter: Payer: Self-pay | Admitting: Family Medicine

## 2017-09-28 MED ORDER — CEFDINIR 300 MG PO CAPS: 300.0000 mg | ORAL_CAPSULE | Freq: Two times a day (BID) | ORAL | 0 refills | Status: DC

## 2017-09-28 MED ORDER — PREDNISONE 10 MG PO TABS: ORAL_TABLET | ORAL | 0 refills | Status: DC

## 2017-11-01 ENCOUNTER — Encounter: Payer: Self-pay | Admitting: Family Medicine

## 2017-11-03 ENCOUNTER — Ambulatory Visit: Payer: BLUE CROSS/BLUE SHIELD | Admitting: Family Medicine

## 2017-11-03 ENCOUNTER — Encounter: Payer: Self-pay | Admitting: Family Medicine

## 2017-11-03 VITALS — BP 116/76 | HR 77 | Temp 98.5°F | Ht 64.0 in | Wt 258.0 lb

## 2017-11-03 DIAGNOSIS — R1011 Right upper quadrant pain: Secondary | ICD-10-CM | POA: Diagnosis not present

## 2017-11-03 NOTE — Progress Notes (Signed)
BP 116/76   Pulse 77   Temp 98.5 F (36.9 C) (Oral)   Ht  (1.626 m)   Wt 258 lb (117 kg)   BMI 44.29 kg/m    Subjective:    Patient ID: Sherri Patel, female    DOB: 1975-07-03, 41 y.o.   MRN: [id number]  HPI: Sherri Patel is a 42 y.o. female presenting on 11/03/2017 for stomach pain every time she eats   HPI Patient has been having upper abdominal pain every time she eats for the past 3 days.  She says if she eats then about 15 to 20 minutes after she will have this upper abdominal pain that will last sometimes for hours depending on what she is eaten.  She said last night she had it for 5 or 6 hours after she ate some beef tips.  She says it pretty much happens after she eats almost everything except that she has been able to eat plain bread.  She says the pain when it comes on is quite severe and currently today the pain is almost completely gone but she has not eaten hardly anything today because she wanted to avoid the pain.  She denies any fevers or chills.  She does have some nausea but no vomiting.  She denies any diarrhea or constipation or blood in her stool.  Relevant past medical, surgical, family and social history reviewed and updated as indicated. Interim medical history since our last visit reviewed. Allergies and medications reviewed and updated.  Review of Systems  Constitutional: Negative for chills and fever.  Eyes: Negative for visual disturbance.  Respiratory: Negative for chest tightness and shortness of breath.   Cardiovascular: Negative for chest pain and leg swelling.  Gastrointestinal: Positive for abdominal pain and nausea.  Genitourinary: Negative for difficulty urinating, flank pain and frequency.  Musculoskeletal: Negative for back pain and gait problem.  Skin: Negative for rash.  Neurological: Negative for light-headedness and headaches.  Psychiatric/Behavioral: Negative for agitation and behavioral problems.  All other systems reviewed  and are negative.   Per HPI unless specifically indicated above   Allergies as of 11/03/2017      Reactions   Penicillins    Upset stomach      Medication List        Accurate as of 11/03/17  6:29 PM. Always use your most recent med list.          lamoTRIgine 150 MG tablet Commonly known as:  LAMICTAL Take 1 tablet by mouth 2 (two) times daily.   Levetiracetam 750 MG Tb24 Take 1 tablet by mouth 3 (three) times daily.   levothyroxine 150 MCG tablet Commonly known as:  SYNTHROID, LEVOTHROID Take 1 tablet (150 mcg total) by mouth daily.   medroxyPROGESTERone 150 MG/ML injection Commonly known as:  DEPO-PROVERA Inject 150 mg into the muscle every 3 (three) months.   multivitamin tablet Take 1 tablet by mouth daily.   Vitamin D3 1000 units Caps Take 1 capsule by mouth 3 (three) times daily.          Objective:    BP 116/76   Pulse 77   Temp 98.5 F (36.9 C) (Oral)   Ht  (1.626 m)   Wt 258 lb (117 kg)   BMI 44.29 kg/m   Wt Readings from Last 3 Encounters:  11/03/17 258 lb (117 kg)  09/10/17 258 lb 12.8 oz (117.4 kg)  06/25/17 257 lb 9.6 oz (116.8 kg)  Physical Exam  Constitutional: She is oriented to person, place, and time. She appears well-developed and well-nourished. No distress.  Eyes: Conjunctivae are normal.  Cardiovascular: Normal rate, regular rhythm, normal heart sounds and intact distal pulses.  No murmur heard. Pulmonary/Chest: Effort normal and breath sounds normal. No respiratory distress. She has no wheezes.  Abdominal: Soft. Bowel sounds are normal. She exhibits no distension and no mass. There is tenderness (Epigastric and right upper quadrant abdominal tenderness, negative Murphy sign but pain is minimal compared to what it was previously). There is no rebound and no guarding.  Musculoskeletal: Normal range of motion. She exhibits no edema or tenderness.  Neurological: She is alert and oriented to person, place, and time.  Coordination normal.  Skin: Skin is warm and dry. No rash noted. She is not diaphoretic.  Psychiatric: She has a normal mood and affect. Her behavior is normal.  Nursing note and vitals reviewed.       Assessment & Plan:   Problem List Items Addressed This Visit    None    Visit Diagnoses    Right upper quadrant abdominal pain    -  Primary   Relevant Orders   US Abdomen Limited RUQ      Patient fits classic gallbladder/cholecystitis symptoms, will order ultrasound for tomorrow and likely do general surgery referral Follow up plan: Return if symptoms worsen or fail to improve.  Counseling provided for all of the vaccine components Orders Placed This Encounter  Procedures  . US Abdomen Limited RUQ    Arville Care, MD [facility] 11/03/2017, 6:29 PM

## 2017-11-04 ENCOUNTER — Telehealth: Payer: Self-pay | Admitting: Family Medicine

## 2017-11-04 ENCOUNTER — Ambulatory Visit (HOSPITAL_COMMUNITY)
Admission: RE | Admit: 2017-11-04 | Discharge: 2017-11-04 | Disposition: A | Discharge status: Home / Self Care | Payer: BLUE CROSS/BLUE SHIELD | Source: Ambulatory Visit | Attending: Family Medicine | Admitting: Family Medicine

## 2017-11-04 DIAGNOSIS — R1011 Right upper quadrant pain: Secondary | ICD-10-CM | POA: Diagnosis not present

## 2017-11-04 DIAGNOSIS — R1010 Upper abdominal pain, unspecified: Secondary | ICD-10-CM | POA: Diagnosis not present

## 2017-11-04 NOTE — Telephone Encounter (Signed)
Please let patient know that her right upper quadrant ultrasound did not show gallstones but there is still the chance that this could be her gallbladder if it were an ejection fraction problem with the gallbladder, I have ordered a HIDA scan which can be done for this.  Continue on a mostly bland diet for now. Arville Care, MD [facility] 11/04/2017, 12:56 PM

## 2017-11-04 NOTE — Telephone Encounter (Signed)
Aware of result and recommendations

## 2017-11-11 ENCOUNTER — Encounter (HOSPITAL_COMMUNITY): Payer: Self-pay

## 2017-11-11 ENCOUNTER — Encounter (HOSPITAL_COMMUNITY)
Admission: RE | Admit: 2017-11-11 | Discharge: 2017-11-11 | Disposition: A | Discharge status: Home / Self Care | Payer: BLUE CROSS/BLUE SHIELD | Source: Ambulatory Visit | Attending: Family Medicine | Admitting: Family Medicine

## 2017-11-11 DIAGNOSIS — R1011 Right upper quadrant pain: Secondary | ICD-10-CM | POA: Diagnosis not present

## 2017-11-11 DIAGNOSIS — R109 Unspecified abdominal pain: Secondary | ICD-10-CM | POA: Diagnosis not present

## 2017-11-11 MED ORDER — TECHNETIUM TC 99M MEBROFENIN IV KIT
5.0000 | PACK | Freq: Once | INTRAVENOUS | Status: AC | PRN
  Administered 2017-11-11: 5 via INTRAVENOUS

## 2017-11-12 ENCOUNTER — Telehealth: Payer: Self-pay | Admitting: Family Medicine

## 2017-11-12 NOTE — Telephone Encounter (Signed)
Pt called and aware of HIDA scan

## 2017-11-18 ENCOUNTER — Encounter: Payer: Self-pay | Admitting: Family Medicine

## 2017-11-18 DIAGNOSIS — R1011 Right upper quadrant pain: Secondary | ICD-10-CM

## 2017-12-07 DIAGNOSIS — Z3042 Encounter for surveillance of injectable contraceptive: Secondary | ICD-10-CM | POA: Diagnosis not present

## 2017-12-16 ENCOUNTER — Ambulatory Visit: Payer: BLUE CROSS/BLUE SHIELD | Admitting: General Surgery

## 2017-12-16 ENCOUNTER — Encounter: Payer: Self-pay | Admitting: General Surgery

## 2017-12-16 VITALS — BP 136/76 | HR 82 | Temp 98.4°F | Resp 18 | Wt 252.0 lb

## 2017-12-16 DIAGNOSIS — R1011 Right upper quadrant pain: Secondary | ICD-10-CM

## 2017-12-16 MED ORDER — SUCRALFATE 1 GM/10ML PO SUSP: 1.0000 g | Freq: Three times a day (TID) | ORAL | 1 refills | Status: DC

## 2017-12-16 MED ORDER — PANTOPRAZOLE SODIUM 40 MG PO TBEC: 40.0000 mg | DELAYED_RELEASE_TABLET | Freq: Two times a day (BID) | ORAL | 1 refills | Status: DC

## 2017-12-16 NOTE — Progress Notes (Signed)
[facility] History and Physical  Reason for Referral: RUQ pain  Referring Physician:  Dr. Louanne Skyeettinger    Sherri Patel is a 42 y.o. female.  HPI: Sherri Patel is a 42 yo with reported RUQ abdominal pain and back pain since about mid May. She says that this started and is associated with food intake. Since that time she has lost about 6 lbs, and has been evaluated with an US of the RUQ and HIDA scan.  No stones or abnormal finding has been documented in the gallbladder, but she continues to have the pain and says that fatty food, meat, sugary foods make the pain worse.  She has no bloating or vomiting, but does have some nausea.  She says when the pain occurs it last about 6 hrs and then will pass.  She did have pain with the Ensure drink for the pain.    She does not take excessive NSAIDs, BC powder, etc.  She has some reflux and has been on some ranitidine without much change in her abdominal pain symptoms.   Past Medical History:  Diagnosis Date  . Allergy   . Anxiety   . Dyslipidemia   . Epilepsy (HCC)   . Hyperlipidemia   . Hypothyroidism   . Overweight(278.02)   . Seizures (HCC)   . SOB (shortness of breath)   . Stress     Past Surgical History:  Procedure Laterality Date  . KNEE SURGERY    . Resection of Right dorsal ganglion    . WRIST SURGERY      Family History  Problem Relation Age of Onset  . COPD Mother   . Cancer Mother   . Diabetes Mother   . Depression Mother   . Bipolar disorder Mother   . COPD Father   . Cancer Father        prostate  . Hypertension Father     Social History   Tobacco Use  . Smoking status: Never Smoker  . Smokeless tobacco: Never Used  Substance Use Topics  . Alcohol use: No  . Drug use: No    Medications: I have reviewed the patient's current medications. Allergies as of 12/16/2017      Reactions   Penicillins    Upset stomach      Medication List        Accurate as of 12/16/17  1:55 PM. Always use  your most recent med list.          lamoTRIgine 150 MG tablet Commonly known as:  LAMICTAL Take 1 tablet by mouth 2 (two) times daily.   Levetiracetam 750 MG Tb24 Take 1 tablet by mouth 3 (three) times daily.   levothyroxine 150 MCG tablet Commonly known as:  SYNTHROID, LEVOTHROID Take 1 tablet (150 mcg total) by mouth daily.   medroxyPROGESTERone 150 MG/ML injection Commonly known as:  DEPO-PROVERA Inject 150 mg into the muscle every 3 (three) months.   multivitamin tablet Take 1 tablet by mouth daily.   Vitamin D3 1000 units Caps Take 1 capsule by mouth 3 (three) times daily.        ROS:  A comprehensive review of systems was negative except for: Gastrointestinal: positive for abdominal pain and nausea Allergic/Immunologic: positive for hay fever  Blood pressure 136/76, pulse 82, temperature 98.4 F (36.9 Patel), temperature source Temporal, resp. rate 18, weight 252 lb (114.3 kg). Physical Exam  Constitutional: She is oriented to person, place, and time. She appears well-developed and well-nourished.  HENT:  Head: Normocephalic.  Eyes: Pupils are equal, round, and reactive to light.  Neck: Normal range of motion. Neck supple.  Cardiovascular: Normal rate and regular rhythm.  Pulmonary/Chest: Effort normal and breath sounds normal.  Abdominal: Soft. She exhibits no distension and no mass. There is tenderness in the right upper quadrant. There is no guarding.  Musculoskeletal: Normal range of motion. She exhibits no edema.  Neurological: She is alert and oriented to person, place, and time.  Skin: Skin is warm and dry.  Psychiatric: She has a normal mood and affect. Her behavior is normal. Judgment and thought content normal.  Vitals reviewed.   Results: 10/2017 [place] RUQ  FINDINGS: Gallbladder:  No gallstones or wall thickening visualized. There is no pericholecystic fluid. No sonographic Murphy sign noted by sonographer.  Common bile duct:  Diameter: 2 mm.  No intrahepatic or extrahepatic biliary duct dilatation.  Liver:  No focal lesion identified. Within normal limits in parenchymal echogenicity. Portal vein is patent on color Doppler imaging with normal direction of blood flow towards the liver.  IMPRESSION: Study within normal limits.  HIDA 11/2017 VIEWS: Anterior right upper quadrant  RADIOPHARMACEUTICALS:  5.0 mCi Tc-40m  Choletec IV  COMPARISON:  None.  FINDINGS: Liver uptake of radiotracer is normal. There is prompt visualization of gallbladder and small bowel, indicating patency of the cystic and common bile ducts. The patient consumed 8 ounces of Ensure Plus orally with calculation of the computer generated ejection fraction of radiotracer from the gallbladder. The patient experienced mild abdominal pain with the oral Ensure consumption. The computer generated ejection fraction of radiotracer from the gallbladder is normal at 78%, normal greater than 33% using the oral agent.  IMPRESSION: Normal ejection fraction of radiotracer from the gallbladder. Patient did experience mild abdominal pain with the oral Ensure consumption. Cystic and common bile ducts are patent as is evidenced by visualization of gallbladder and small bowel.  Assessment & Plan:  Sherri Patel is a 42 y.o. female with RUQ pain that sounds very much like it could be gallbladder/ biliary in nature given the connect with food intake and the chronicity of the problem.  We have discussed that this could be from the gallbladder and possible sludge /or chronic cholecystitis or from gastritis/ or an ulcer that is not being adequately treated with the ranitidine.  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 Her HIDA was negative but she did have pain with the Ensure.  We have discussed the options of proceeding with removal of the gallbladder versus treating any  gastritis/ ulcer more thoroughly with a PPI and carafate.  We also could consider GI /EGD if not improving but with maximal therapies, I am not sure what more they could offer other than H pylori testing.    Twice daily protonix and carafate and see if this improves your symptoms.  Call in 2 weeks to let us know how you are feeling.   All questions were answered to the satisfaction of the patient and family.   Sherri Patel 12/16/2017, 1:55 PM

## 2017-12-16 NOTE — Patient Instructions (Signed)
Unsure of what is causing right sided abdominal pain. Here is information on gastritis, ulcer and gallbladder disease for now. We will treat you with twice daily protonix and carafate and see if this improves your symptoms.   Call in 2 weeks to let us know how you are feeling.   Gastritis, Adult Gastritis is swelling (inflammation) of the stomach. When you have this condition, you can have these problems (symptoms):  Pain in your stomach.  A burning feeling in your stomach.  Feeling sick to your stomach (nauseous).  Throwing up (vomiting).  Feeling too full after you eat.  It is important to get help for this condition. Without help, your stomach can bleed, and you can get sores (ulcers) in your stomach. Follow these instructions at home:  Take over-the-counter and prescription medicines only as told by your doctor.  If you were prescribed an antibiotic medicine, take it as told by your doctor. Do not stop taking it even if you start to feel better.  Drink enough fluid to keep your pee (urine) clear or pale yellow.  Instead of eating big meals, eat small meals often. Contact a health care provider if:  Your problems get worse.  Your problems go away and then come back. Get help right away if:  You throw up blood or something that looks like coffee grounds.  You have black or dark red poop (stools).  You cannot keep fluids down.  Your stomach pain gets worse.  You have a fever.  You do not feel better after 1 week. This information is not intended to replace advice given to you by your health care provider. Make sure you discuss any questions you have with your health care provider. Document Released: 11/11/2007 Document Revised: 01/22/2016 Document Reviewed: 02/16/2015 Elsevier Interactive Patient Education  2018 Elsevier Inc.   Peptic Ulcer A peptic ulcer is a painful sore in the lining of your esophagus, stomach, or the first part of your small intestine. You may  have pain in the area between your chest and your belly button. The most common causes of an ulcer are:  An infection.  Using certain pain medicines too often or too much.  Follow these instructions at home:  Avoid alcohol.  Avoid caffeine.  Do not use any tobacco products. These include cigarettes, chewing tobacco, and e-cigarettes. If you need help quitting, ask your doctor.  Take over-the-counter and prescription medicines only as told by your doctor. Do not stop or change your medicines unless you talk with your doctor about it first.  Keep all follow-up visits as told by your doctor. This is important. Contact a doctor if:  You do not get better in 7 days after you start treatment.  You keep having an upset stomach (indigestion) or heartburn. Get help right away if:  You have sudden, sharp pain in your belly (abdomen).  You have lasting belly pain.  You have bloody poop (stool) or black, tarry poop.  You throw up (vomit) blood. It may look like coffee grounds.  You feel light-headed or feel like you may pass out (faint).  You get weak.  You get sweaty or feel sticky and cold to the touch (clammy). This information is not intended to replace advice given to you by your health care provider. Make sure you discuss any questions you have with your health care provider. Document Released: 08/19/2009 Document Revised: 10/09/2015 Document Reviewed: 02/23/2015 Elsevier Interactive Patient Education  2018 [employer].  Food Choices for Peptic  Ulcer Disease When you have peptic ulcer disease, the foods you eat and your eating habits are very important. Choosing the right foods can help ease the discomfort of peptic ulcer disease. What general guidelines do I need to follow?  Choose fruits, vegetables, whole grains, and low-fat meat, fish, and poultry.  Keep a food diary to identify foods that cause symptoms.  Avoid foods that cause irritation or pain. These may be  different for different people.  Eat frequent small meals instead of three large meals each day. The pain may be worse when your stomach is empty.  Avoid eating close to bedtime. What foods are not recommended? The following are some foods and drinks that may worsen your symptoms:  Black, white, and red pepper.  Hot sauce.  Chili peppers.  Chili powder.  Chocolate and cocoa.  Alcohol.  Tea, coffee, and cola (regular and decaffeinated).  The items listed above may not be a complete list of foods and beverages to avoid. Contact your dietitian for more information. This information is not intended to replace advice given to you by your health care provider. Make sure you discuss any questions you have with your health care provider. Document Released: 08/17/2011 Document Revised: 10/31/2015 Document Reviewed: 03/29/2013 Elsevier Interactive Patient Education  2017 Elsevier Inc.  Low-Fat Diet for Pancreatitis or Gallbladder Conditions A low-fat diet can be helpful if you have pancreatitis or a gallbladder condition. With these conditions, your pancreas and gallbladder have trouble digesting fats. A healthy eating plan with less fat will help rest your pancreas and gallbladder and reduce your symptoms. What do I need to know about this diet?  Eat a low-fat diet. ? Reduce your fat intake to less than 20-30% of your total daily calories. This is less than 50-60 g of fat per day. ? Remember that you need some fat in your diet. Ask your dietician what your daily goal should be. ? Choose nonfat and low-fat healthy foods. Look for the words "nonfat," "low fat," or "fat free." ? As a guide, look on the label and choose foods with less than 3 g of fat per serving. Eat only one serving.  Avoid alcohol.  Do not smoke. If you need help quitting, talk with your health care provider.  Eat small frequent meals instead of three large heavy meals. What foods can I eat? Grains Include healthy  grains and starches such as potatoes, wheat bread, fiber-rich cereal, and brown rice. Choose whole grain options whenever possible. In adults, whole grains should account for 45-65% of your daily calories. Fruits and Vegetables Eat plenty of fruits and vegetables. Fresh fruits and vegetables add fiber to your diet. Meats and Other Protein Sources Eat lean meat such as chicken and pork. Trim any fat off of meat before cooking it. Eggs, fish, and beans are other sources of protein. In adults, these foods should account for 10-35% of your daily calories. Dairy Choose low-fat milk and dairy options. Dairy includes fat and protein, as well as calcium. Fats and Oils Limit high-fat foods such as fried foods, sweets, baked goods, sugary drinks. Other Creamy sauces and condiments, such as mayonnaise, can add extra fat. Think about whether or not you need to use them, or use smaller amounts or low fat options. What foods are not recommended?  High fat foods, such as: ? [employer]oro CorporationBaked goods. ? Ice cream. ? JamaicaFrench toast. ? Sweet rolls. ? Pizza. ? Cheese bread. ? Foods covered with batter, butter, creamy sauces, or cheese. ?  Fried foods. ? Sugary drinks and desserts.  Foods that cause gas or bloating This information is not intended to replace advice given to you by your health care provider. Make sure you discuss any questions you have with your health care provider. Document Released: 05/30/2013 Document Revised: 10/31/2015 Document Reviewed: 05/08/2013 Elsevier Interactive Patient Education  2017 [employer].

## 2018-01-02 ENCOUNTER — Encounter: Payer: Self-pay | Admitting: General Surgery

## 2018-01-14 NOTE — Patient Instructions (Signed)
Sherri Patel  01/14/2018     @PREFPERIOPPHARMACY @   Your procedure is scheduled on  01/19/2018   Report to [facility] at 1045   A.M.  Call this number if you have problems the morning of surgery:  [phone]   Remember:  Do not eat or drink after midnight.  You may drink clear liquids until  12 midnight 01/18/2018 .  Clear liquids allowed are:                    Water, Juice (non-citric and without pulp), Carbonated beverages, Clear Tea, Black Coffee only, Plain Jell-O only, Gatorade and Plain Popsicles only    Take these medicines the morning of surgery with A SIP OF WATER  Lamictil, keppra, levothyroxine.    Do not wear jewelry, make-up or nail polish.  Do not wear lotions, powders, or perfumes, or deodorant.  Do not shave 48 hours prior to surgery.  Men may shave face and neck.  Do not bring valuables to the hospital.  [facility] is not responsible for any belongings or valuables.  Contacts, dentures or bridgework may not be worn into surgery.  Leave your suitcase in the car.  After surgery it may be brought to your room.  For patients admitted to the hospital, discharge time will be determined by your treatment team.  Patients discharged the day of surgery will not be allowed to drive home.   Name and phone number of your driver:   family Special instructions:  None  Please read over the following fact sheets that you were given. Anesthesia Post-op Instructions and Care and Recovery After Surgery       Laparoscopic Cholecystectomy Laparoscopic cholecystectomy is surgery to remove the gallbladder. The gallbladder is a pear-shaped organ that lies beneath the liver on the right side of the body. The gallbladder stores bile, which is a fluid that helps the body to digest fats. Cholecystectomy is often done for inflammation of the gallbladder (cholecystitis). This condition is usually caused by a buildup of gallstones (cholelithiasis) in the  gallbladder. Gallstones can block the flow of bile, which can result in inflammation and pain. In severe cases, emergency surgery may be required. This procedure is done though small incisions in your abdomen (laparoscopic surgery). A thin scope with a camera (laparoscope) is inserted through one incision. Thin surgical instruments are inserted through the other incisions. In some cases, a laparoscopic procedure may be turned into a type of surgery that is done through a larger incision (open surgery). Tell a health care provider about:  Any allergies you have.  All medicines you are taking, including vitamins, herbs, eye drops, creams, and over-the-counter medicines.  Any problems you or family members have had with anesthetic medicines.  Any blood disorders you have.  Any surgeries you have had.  Any medical conditions you have.  Whether you are pregnant or may be pregnant. What are the risks? Generally, this is a safe procedure. However, problems may occur, including:  Infection.  Bleeding.  Allergic reactions to medicines.  Damage to other structures or organs.  A stone remaining in the common bile duct. The common bile duct carries bile from the gallbladder into the small intestine.  A bile leak from the cyst duct that is clipped when your gallbladder is removed.  What happens before the procedure? Staying hydrated Follow instructions from your health care provider about hydration, which may include:  Up to 2 hours before the procedure - you may continue to drink clear liquids, such as water, clear fruit juice, black coffee, and plain tea.  Eating and drinking restrictions Follow instructions from your health care provider about eating and drinking, which may include:  8 hours before the procedure - stop eating heavy meals or foods such as meat, fried foods, or fatty foods.  6 hours before the procedure - stop eating light meals or foods, such as toast or cereal.  6  hours before the procedure - stop drinking milk or drinks that contain milk.  2 hours before the procedure - stop drinking clear liquids.  Medicines  Ask your health care provider about: ? Changing or stopping your regular medicines. This is especially important if you are taking diabetes medicines or blood thinners. ? Taking medicines such as aspirin and ibuprofen. These medicines can thin your blood. Do not take these medicines before your procedure if your health care provider instructs you not to.  You may be given antibiotic medicine to help prevent infection. General instructions  Let your health care provider know if you develop a cold or an infection before surgery.  Plan to have someone take you home from the hospital or clinic.  Ask your health care provider how your surgical site will be marked or identified. What happens during the procedure?  To reduce your risk of infection: ? Your health care team will wash or sanitize their hands. ? Your skin will be washed with soap. ? Hair may be removed from the surgical area.  An IV tube may be inserted into one of your veins.  You will be given one or more of the following: ? A medicine to help you relax (sedative). ? A medicine to make you fall asleep (general anesthetic).  A breathing tube will be placed in your mouth.  Your surgeon will make several small cuts (incisions) in your abdomen.  The laparoscope will be inserted through one of the small incisions. The camera on the laparoscope will send images to a TV screen (monitor) in the operating room. This lets your surgeon see inside your abdomen.  Air-like gas will be pumped into your abdomen. This will expand your abdomen to give the surgeon more room to perform the surgery.  Other tools that are needed for the procedure will be inserted through the other incisions. The gallbladder will be removed through one of the incisions.  Your common bile duct may be examined.  If stones are found in the common bile duct, they may be removed.  After your gallbladder has been removed, the incisions will be closed with stitches (sutures), staples, or skin glue.  Your incisions may be covered with a bandage (dressing). The procedure may vary among health care providers and hospitals. What happens after the procedure?  Your blood pressure, heart rate, breathing rate, and blood oxygen level will be monitored until the medicines you were given have worn off.  You will be given medicines as needed to control your pain.  Do not drive for 24 hours if you were given a sedative. This information is not intended to replace advice given to you by your health care provider. Make sure you discuss any questions you have with your health care provider. Document Released: 05/25/2005 Document Revised: 12/15/2015 Document Reviewed: 11/11/2015 Elsevier Interactive Patient Education  2018 [employer].  Laparoscopic Cholecystectomy, Care After This sheet gives you information about how to care for yourself after your procedure. Your  health care provider may also give you more specific instructions. If you have problems or questions, contact your health care provider. What can I expect after the procedure? After the procedure, it is common to have:  Pain at your incision sites. You will be given medicines to control this pain.  Mild nausea or vomiting.  Bloating and possible shoulder pain from the air-like gas that was used during the procedure.  Follow these instructions at home: Incision care   Follow instructions from your health care provider about how to take care of your incisions. Make sure you: ? Wash your hands with soap and water before you change your bandage (dressing). If soap and water are not available, use hand sanitizer. ? Change your dressing as told by your health care provider. ? Leave stitches (sutures), skin glue, or adhesive strips in place. These skin  closures may need to be in place for 2 weeks or longer. If adhesive strip edges start to loosen and curl up, you may trim the loose edges. Do not remove adhesive strips completely unless your health care provider tells you to do that.  Do not take baths, swim, or use a hot tub until your health care provider approves. Ask your health care provider if you can take showers. You may only be allowed to take sponge baths for bathing.  Check your incision area every day for signs of infection. Check for: ? More redness, swelling, or pain. ? More fluid or blood. ? Warmth. ? Pus or a bad smell. Activity  Do not drive or use heavy machinery while taking prescription pain medicine.  Do not lift anything that is heavier than 10 lb (4.5 kg) until your health care provider approves.  Do not play contact sports until your health care provider approves.  Do not drive for 24 hours if you were given a medicine to help you relax (sedative).  Rest as needed. Do not return to work or school until your health care provider approves. General instructions  Take over-the-counter and prescription medicines only as told by your health care provider.  To prevent or treat constipation while you are taking prescription pain medicine, your health care provider may recommend that you: ? Drink enough fluid to keep your urine clear or pale yellow. ? Take over-the-counter or prescription medicines. ? Eat foods that are high in fiber, such as fresh fruits and vegetables, whole grains, and beans. ? Limit foods that are high in fat and processed sugars, such as fried and sweet foods. Contact a health care provider if:  You develop a rash.  You have more redness, swelling, or pain around your incisions.  You have more fluid or blood coming from your incisions.  Your incisions feel warm to the touch.  You have pus or a bad smell coming from your incisions.  You have a fever.  One or more of your incisions breaks  open. Get help right away if:  You have trouble breathing.  You have chest pain.  You have increasing pain in your shoulders.  You faint or feel dizzy when you stand.  You have severe pain in your abdomen.  You have nausea or vomiting that lasts for more than one day.  You have leg pain. This information is not intended to replace advice given to you by your health care provider. Make sure you discuss any questions you have with your health care provider. Document Released: 05/25/2005 Document Revised: 12/14/2015 Document Reviewed: 11/11/2015 Elsevier Interactive Patient  Education  2018 [employer].  General Anesthesia, Adult General anesthesia is the use of medicines to make a person "go to sleep" (be unconscious) for a medical procedure. General anesthesia is often recommended when a procedure:  Is long.  Requires you to be still or in an unusual position.  Is major and can cause you to lose blood.  Is impossible to do without general anesthesia.  The medicines used for general anesthesia are called general anesthetics. In addition to making you sleep, the medicines:  Prevent pain.  Control your blood pressure.  Relax your muscles.  Tell a health care provider about:  Any allergies you have.  All medicines you are taking, including vitamins, herbs, eye drops, creams, and over-the-counter medicines.  Any problems you or family members have had with anesthetic medicines.  Types of anesthetics you have had in the past.  Any bleeding disorders you have.  Any surgeries you have had.  Any medical conditions you have.  Any history of heart or lung conditions, such as heart failure, sleep apnea, or chronic obstructive pulmonary disease (COPD).  Whether you are pregnant or may be pregnant.  Whether you use tobacco, alcohol, marijuana, or street drugs.  Any history of [employer].  Any history of depression or anxiety. What are the risks? Generally,  this is a safe procedure. However, problems may occur, including:  Allergic reaction to anesthetics.  Lung and heart problems.  Inhaling food or liquids from your stomach into your lungs (aspiration).  Injury to nerves.  Waking up during your procedure and being unable to move (rare).  Extreme agitation or a state of mental confusion (delirium) when you wake up from the anesthetic.  Air in the bloodstream, which can lead to stroke.  These problems are more likely to develop if you are having a major surgery or if you have an advanced medical condition. You can prevent some of these complications by answering all of your health care provider's questions thoroughly and by following all pre-procedure instructions. General anesthesia can cause side effects, including:  Nausea or vomiting  A sore throat from the breathing tube.  Feeling cold or shivery.  Feeling tired, washed out, or achy.  Sleepiness or drowsiness.  Confusion or agitation.  What happens before the procedure? Staying hydrated Follow instructions from your health care provider about hydration, which may include:  Up to 2 hours before the procedure - you may continue to drink clear liquids, such as water, clear fruit juice, black coffee, and plain tea.  Eating and drinking restrictions Follow instructions from your health care provider about eating and drinking, which may include:  8 hours before the procedure - stop eating heavy meals or foods such as meat, fried foods, or fatty foods.  6 hours before the procedure - stop eating light meals or foods, such as toast or cereal.  6 hours before the procedure - stop drinking milk or drinks that contain milk.  2 hours before the procedure - stop drinking clear liquids.  Medicines  Ask your health care provider about: ? Changing or stopping your regular medicines. This is especially important if you are taking diabetes medicines or blood thinners. ? Taking  medicines such as aspirin and ibuprofen. These medicines can thin your blood. Do not take these medicines before your procedure if your health care provider instructs you not to. ? Taking new dietary supplements or medicines. Do not take these during the week before your procedure unless your health care provider approves  them.  If you are told to take a medicine or to continue taking a medicine on the day of the procedure, take the medicine with sips of water. General instructions   Ask if you will be going home the same day, the following day, or after a longer hospital stay. ? Plan to have someone take you home. ? Plan to have someone stay with you for the first 24 hours after you leave the hospital or clinic.  For 3-6 weeks before the procedure, try not to use any tobacco products, such as cigarettes, chewing tobacco, and e-cigarettes.  You may brush your teeth on the morning of the procedure, but make sure to spit out the toothpaste. What happens during the procedure?  You will be given anesthetics through a mask and through an IV tube in one of your veins.  You may receive medicine to help you relax (sedative).  As soon as you are asleep, a breathing tube may be used to help you breathe.  An anesthesia specialist will stay with you throughout the procedure. He or she will help keep you comfortable and safe by continuing to give you medicines and adjusting the amount of medicine that you get. He or she will also watch your blood pressure, pulse, and oxygen levels to make sure that the anesthetics do not cause any problems.  If a breathing tube was used to help you breathe, it will be removed before you wake up. The procedure may vary among health care providers and hospitals. What happens after the procedure?  You will wake up, often slowly, after the procedure is complete, usually in a recovery area.  Your blood pressure, heart rate, breathing rate, and blood oxygen level will be  monitored until the medicines you were given have worn off.  You may be given medicine to help you calm down if you feel anxious or agitated.  If you will be going home the same day, your health care provider may check to make sure you can stand, drink, and urinate.  Your health care providers will treat your pain and side effects before you go home.  Do not drive for 24 hours if you received a sedative.  You may: ? Feel nauseous and vomit. ? Have a sore throat. ? Have mental slowness. ? Feel cold or shivery. ? Feel sleepy. ? Feel tired. ? Feel sore or achy, even in parts of your body where you did not have surgery. This information is not intended to replace advice given to you by your health care provider. Make sure you discuss any questions you have with your health care provider. Document Released: 09/01/2007 Document Revised: 11/05/2015 Document Reviewed: 05/09/2015 Elsevier Interactive Patient Education  2018 [employer]. General Anesthesia, Adult, Care After These instructions provide you with information about caring for yourself after your procedure. Your health care provider may also give you more specific instructions. Your treatment has been planned according to current medical practices, but problems sometimes occur. Call your health care provider if you have any problems or questions after your procedure. What can I expect after the procedure? After the procedure, it is common to have:  Vomiting.  A sore throat.  Mental slowness.  It is common to feel:  Nauseous.  Cold or shivery.  Sleepy.  Tired.  Sore or achy, even in parts of your body where you did not have surgery.  Follow these instructions at home: For at least 24 hours after the procedure:  Do not: ?  Participate in activities where you could fall or become injured. ? Drive. ? Use heavy machinery. ? Drink alcohol. ? Take sleeping pills or medicines that cause drowsiness. ? Make important  decisions or sign legal documents. ? Take care of children on your own.  Rest. Eating and drinking  If you vomit, drink water, juice, or soup when you can drink without vomiting.  Drink enough fluid to keep your urine clear or pale yellow.  Make sure you have little or no nausea before eating solid foods.  Follow the diet recommended by your health care provider. General instructions  Have a responsible adult stay with you until you are awake and alert.  Return to your normal activities as told by your health care provider. Ask your health care provider what activities are safe for you.  Take over-the-counter and prescription medicines only as told by your health care provider.  If you smoke, do not smoke without supervision.  Keep all follow-up visits as told by your health care provider. This is important. Contact a health care provider if:  You continue to have nausea or vomiting at home, and medicines are not helpful.  You cannot drink fluids or start eating again.  You cannot urinate after 8-12 hours.  You develop a skin rash.  You have fever.  You have increasing redness at the site of your procedure. Get help right away if:  You have difficulty breathing.  You have chest pain.  You have unexpected bleeding.  You feel that you are having a life-threatening or urgent problem. This information is not intended to replace advice given to you by your health care provider. Make sure you discuss any questions you have with your health care provider. Document Released: 08/31/2000 Document Revised: 10/28/2015 Document Reviewed: 05/09/2015 Elsevier Interactive Patient Education  [employer].

## 2018-01-14 NOTE — H&P (Signed)
[facility] History and Physical  Reason for Referral: RUQ pain  Referring Physician:  Dr. Louanne Skyeettinger   Dia CrawfordKristy C Patel is a 42 y.o. female.  HPI: [name] is a 42 yo with reported RUQ abdominal pain and back pain since about mid May. She says that this started and is associated with food intake. Since that time she has lost about 6 lbs, and has been evaluated with an US of the RUQ and HIDA scan.  No stones or abnormal finding has been documented in the gallbladder, but she continues to have the pain and says that fatty food, meat, sugary foods make the pain worse.  She has no bloating or vomiting, but does have some nausea.  She says when the pain occurs it last about 6 hrs and then will pass.  She did have pain with the Ensure drink for the pain.    She does not take excessive NSAIDs, BC powder, etc.  She has some reflux and has been on some ranitidine without much change in her abdominal pain symptoms.       Past Medical History:  Diagnosis Date  . Allergy   . Anxiety   . Dyslipidemia   . Epilepsy (HCC)   . Hyperlipidemia   . Hypothyroidism   . Overweight(278.02)   . Seizures (HCC)   . SOB (shortness of breath)   . Stress          Past Surgical History:  Procedure Laterality Date  . KNEE SURGERY    . Resection of Right dorsal ganglion    . WRIST SURGERY           Family History  Problem Relation Age of Onset  . COPD Mother   . Cancer Mother   . Diabetes Mother   . Depression Mother   . Bipolar disorder Mother   . COPD Father   . Cancer Father        prostate  . Hypertension Father     Social History       Tobacco Use  . Smoking status: Never Smoker  . Smokeless tobacco: Never Used  Substance Use Topics  . Alcohol use: No  . Drug use: No    Medications: I have reviewed the patient's current medications. Allergies as of 12/16/2017      Reactions   Penicillins    Upset stomach                 Medication List            Accurate as of 12/16/17  1:55 PM. Always use your most recent med list.           lamoTRIgine 150 MG tablet Commonly known as:  LAMICTAL Take 1 tablet by mouth 2 (two) times daily.   Levetiracetam 750 MG Tb24 Take 1 tablet by mouth 3 (three) times daily.   levothyroxine 150 MCG tablet Commonly known as:  SYNTHROID, LEVOTHROID Take 1 tablet (150 mcg total) by mouth daily.   medroxyPROGESTERone 150 MG/ML injection Commonly known as:  DEPO-PROVERA Inject 150 mg into the muscle every 3 (three) months.   multivitamin tablet Take 1 tablet by mouth daily.   Vitamin D3 1000 units Caps Take 1 capsule by mouth 3 (three) times daily.        ROS:  A comprehensive review of systems was negative except for: Gastrointestinal: positive for abdominal pain and nausea Allergic/Immunologic: positive for hay fever  Blood pressure 136/76, pulse 82, temperature 98.4  F (36.9 C), temperature source Temporal, resp. rate 18, weight 252 lb (114.3 kg). Physical Exam  Constitutional: She is oriented to person, place, and time. She appears well-developed and well-nourished.  HENT:  Head: Normocephalic.  Eyes: Pupils are equal, round, and reactive to light.  Neck: Normal range of motion. Neck supple.  Cardiovascular: Normal rate and regular rhythm.  Pulmonary/Chest: Effort normal and breath sounds normal.  Abdominal: Soft. She exhibits no distension and no mass. There is tenderness in the right upper quadrant. There is no guarding.  Musculoskeletal: Normal range of motion. She exhibits no edema.  Neurological: She is alert and oriented to person, place, and time.  Skin: Skin is warm and dry.  Psychiatric: She has a normal mood and affect. Her behavior is normal. Judgment and thought content normal.  Vitals reviewed.   Results: 10/2017 [place] RUQ  FINDINGS: Gallbladder:  No gallstones or wall thickening visualized. There is  no pericholecystic fluid. No sonographic Murphy sign noted by sonographer.  Common bile duct:  Diameter: 2 mm. No intrahepatic or extrahepatic biliary duct dilatation.  Liver:  No focal lesion identified. Within normal limits in parenchymal echogenicity. Portal vein is patent on color Doppler imaging with normal direction of blood flow towards the liver.  IMPRESSION: Study within normal limits.  HIDA 11/2017 VIEWS: Anterior right upper quadrant  RADIOPHARMACEUTICALS: 5.0 mCi Tc-76m Choletec IV  COMPARISON: None.  FINDINGS: Liver uptake of radiotracer is normal. There is prompt visualization of gallbladder and small bowel, indicating patency of the cystic and common bile ducts. The patient consumed 8 ounces of Ensure Plus orally with calculation of the computer generated ejection fraction of radiotracer from the gallbladder. The patient experienced mild abdominal pain with the oral Ensure consumption. The computer generated ejection fraction of radiotracer from the gallbladder is normal at 78%, normal greater than 33% using the oral agent.  IMPRESSION: Normal ejection fraction of radiotracer from the gallbladder. Patient did experience mild abdominal pain with the oral Ensure consumption. Cystic and common bile ducts are patent as is evidenced by visualization of gallbladder and small bowel.  Assessment & Plan:  Sherri Patel is a 41 y.o. female with RUQ pain that sounds very much like it could be gallbladder/ biliary in nature given the connect with food intake and the chronicity of the problem.  We have discussed that this could be from the gallbladder and possible sludge /or chronic cholecystitis or from gastritis/ or an ulcer that is not being adequately treated with the ranitidine.  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 Her HIDA was negative but she did have pain  with the Ensure.  We have discussed the options of proceeding with removal of the gallbladder versus treating any gastritis/ ulcer more thoroughly with a PPI and carafate.  We also could consider GI /EGD if not improving but with maximal therapies, I am not sure what more they could offer other than H pylori testing.    Twice daily protonix and carafate and see if this improves your symptoms.  Call in 2 weeks to let us know how you are feeling.   All questions were answered to the satisfaction of the patient and family.  Lucretia Roers 12/16/2017, 1:55 PM   Patient called and told us she was not improved with medications.  Plan for Lap chole.   PLAN: I counseled the patient about the indication, risks and benefits of laparoscopic cholecystectomy.  She understands there is a very small chance for bleeding, infection, injury to normal structures (including common bile duct), conversion to open surgery, persistent symptoms, evolution of postcholecystectomy diarrhea, need for secondary interventions, anesthesia reaction, cardiopulmonary issues and other risks not specifically detailed here. I described the expected recovery, the plan for follow-up and the restrictions during the recovery phase.  All questions were answered.  Algis Greenhouse, MD [facility] [address] [place], [place] [address] [phone] (office)

## 2018-01-17 ENCOUNTER — Other Ambulatory Visit: Payer: Self-pay

## 2018-01-17 ENCOUNTER — Encounter (HOSPITAL_COMMUNITY): Payer: Self-pay

## 2018-01-17 ENCOUNTER — Encounter (HOSPITAL_COMMUNITY)
Admission: RE | Admit: 2018-01-17 | Discharge: 2018-01-17 | Disposition: A | Discharge status: Home / Self Care | Payer: BLUE CROSS/BLUE SHIELD | Source: Ambulatory Visit | Attending: General Surgery | Admitting: General Surgery

## 2018-01-17 DIAGNOSIS — Z01812 Encounter for preprocedural laboratory examination: Secondary | ICD-10-CM | POA: Diagnosis not present

## 2018-01-17 LAB — BASIC METABOLIC PANEL
Anion gap: 6 (ref 5–15)
BUN: 8 mg/dL (ref 6–20)
CALCIUM: 9 mg/dL (ref 8.9–10.3)
CO2: 22 mmol/L (ref 22–32)
CREATININE: 0.67 mg/dL (ref 0.44–1.00)
Chloride: 112 mmol/L — ABNORMAL HIGH (ref 98–111)
GFR calc non Af Amer: >60 mL/min (ref >60)
Glucose, Bld: 100 mg/dL — ABNORMAL HIGH (ref 70–99)
Potassium: 3.6 mmol/L (ref 3.5–5.1)
SODIUM: 140 mmol/L (ref 135–145)

## 2018-01-17 LAB — CBC WITH DIFFERENTIAL/PLATELET
BASOS PCT: 1 %
Basophils Absolute: 0 10*3/uL (ref 0.0–0.1)
EOS ABS: 0 10*3/uL (ref 0.0–0.7)
EOS PCT: 0 %
HCT: 40.7 % (ref 36.0–46.0)
Hemoglobin: 13.7 g/dL (ref 12.0–15.0)
LYMPHS ABS: 3.3 10*3/uL (ref 0.7–4.0)
Lymphocytes Relative: 42 %
MCH: 29.6 pg (ref 26.0–34.0)
MCHC: 33.7 g/dL (ref 30.0–36.0)
MCV: 87.9 fL (ref 78.0–100.0)
MONO ABS: 0.7 10*3/uL (ref 0.1–1.0)
MONOS PCT: 8 %
Neutro Abs: 3.8 10*3/uL (ref 1.7–7.7)
Neutrophils Relative %: 49 %
Platelets: 305 10*3/uL (ref 150–400)
RBC: 4.63 MIL/uL (ref 3.87–5.11)
RDW: 12 % (ref 11.5–15.5)
WBC: 7.8 10*3/uL (ref 4.0–10.5)

## 2018-01-17 LAB — HCG, SERUM, QUALITATIVE: PREG SERUM: NEGATIVE

## 2018-01-19 ENCOUNTER — Encounter (HOSPITAL_COMMUNITY)
Admission: RE | Disposition: A | Discharge status: Home / Self Care | Payer: Self-pay | Source: Ambulatory Visit | Attending: General Surgery

## 2018-01-19 ENCOUNTER — Encounter (HOSPITAL_COMMUNITY): Payer: Self-pay | Admitting: *Deleted

## 2018-01-19 ENCOUNTER — Ambulatory Visit (HOSPITAL_COMMUNITY): Payer: BLUE CROSS/BLUE SHIELD | Admitting: Anesthesiology

## 2018-01-19 ENCOUNTER — Ambulatory Visit (HOSPITAL_COMMUNITY)
Admission: RE | Admit: 2018-01-19 | Discharge: 2018-01-19 | Disposition: A | Discharge status: Home / Self Care | Payer: BLUE CROSS/BLUE SHIELD | Source: Ambulatory Visit | Attending: General Surgery | Admitting: General Surgery

## 2018-01-19 DIAGNOSIS — Z793 Long term (current) use of hormonal contraceptives: Secondary | ICD-10-CM | POA: Insufficient documentation

## 2018-01-19 DIAGNOSIS — K828 Other specified diseases of gallbladder: Secondary | ICD-10-CM

## 2018-01-19 DIAGNOSIS — E039 Hypothyroidism, unspecified: Secondary | ICD-10-CM | POA: Diagnosis not present

## 2018-01-19 DIAGNOSIS — Z79899 Other long term (current) drug therapy: Secondary | ICD-10-CM | POA: Diagnosis not present

## 2018-01-19 DIAGNOSIS — Z8249 Family history of ischemic heart disease and other diseases of the circulatory system: Secondary | ICD-10-CM | POA: Insufficient documentation

## 2018-01-19 DIAGNOSIS — G40909 Epilepsy, unspecified, not intractable, without status epilepticus: Secondary | ICD-10-CM | POA: Diagnosis not present

## 2018-01-19 DIAGNOSIS — K811 Chronic cholecystitis: Secondary | ICD-10-CM | POA: Insufficient documentation

## 2018-01-19 DIAGNOSIS — Z79890 Hormone replacement therapy (postmenopausal): Secondary | ICD-10-CM | POA: Diagnosis not present

## 2018-01-19 DIAGNOSIS — Z6841 Body Mass Index (BMI) 40.0 and over, adult: Secondary | ICD-10-CM | POA: Diagnosis not present

## 2018-01-19 DIAGNOSIS — Z880 Allergy status to penicillin: Secondary | ICD-10-CM | POA: Insufficient documentation

## 2018-01-19 HISTORY — PX: CHOLECYSTECTOMY: SHX55

## 2018-01-19 SURGERY — LAPAROSCOPIC CHOLECYSTECTOMY
Anesthesia: General | Site: Abdomen

## 2018-01-19 MED ORDER — MIDAZOLAM HCL 2 MG/2ML IJ SOLN
INTRAMUSCULAR | Status: AC
  Filled 2018-01-19: qty 2

## 2018-01-19 MED ORDER — ROCURONIUM BROMIDE 10 MG/ML (PF) SYRINGE
PREFILLED_SYRINGE | INTRAVENOUS | Status: DC | PRN
  Administered 2018-01-19: 5 mg via INTRAVENOUS
  Administered 2018-01-19: 35 mg via INTRAVENOUS

## 2018-01-19 MED ORDER — 0.9 % SODIUM CHLORIDE (POUR BTL) OPTIME
TOPICAL | Status: DC | PRN
  Administered 2018-01-19: 1000 mL

## 2018-01-19 MED ORDER — DOCUSATE SODIUM 100 MG PO CAPS: 100.0000 mg | ORAL_CAPSULE | Freq: Two times a day (BID) | ORAL | 2 refills | Status: DC

## 2018-01-19 MED ORDER — CHLORHEXIDINE GLUCONATE CLOTH 2 % EX PADS: 6.0000 | MEDICATED_PAD | Freq: Once | CUTANEOUS | Status: DC

## 2018-01-19 MED ORDER — PROPOFOL 10 MG/ML IV BOLUS
INTRAVENOUS | Status: DC | PRN
  Administered 2018-01-19: 150 mg via INTRAVENOUS
  Administered 2018-01-19: 50 mg via INTRAVENOUS

## 2018-01-19 MED ORDER — KETOROLAC TROMETHAMINE 30 MG/ML IJ SOLN: 30.0000 mg | Freq: Once | INTRAMUSCULAR | Status: DC | PRN

## 2018-01-19 MED ORDER — ROCURONIUM BROMIDE 50 MG/5ML IV SOLN
INTRAVENOUS | Status: AC
Start: 2018-01-19 — End: ?
  Filled 2018-01-19: qty 1

## 2018-01-19 MED ORDER — HYDROCODONE-ACETAMINOPHEN 7.5-325 MG PO TABS: 1.0000 | ORAL_TABLET | Freq: Once | ORAL | Status: DC | PRN

## 2018-01-19 MED ORDER — LIDOCAINE HCL (PF) 1 % IJ SOLN
INTRAMUSCULAR | Status: DC | PRN
  Administered 2018-01-19: 30 mg

## 2018-01-19 MED ORDER — BUPIVACAINE HCL (PF) 0.5 % IJ SOLN
INTRAMUSCULAR | Status: DC | PRN
  Administered 2018-01-19: 10 mL

## 2018-01-19 MED ORDER — SUCCINYLCHOLINE CHLORIDE 20 MG/ML IJ SOLN
INTRAMUSCULAR | Status: AC
  Filled 2018-01-19: qty 1

## 2018-01-19 MED ORDER — SUGAMMADEX SODIUM 200 MG/2ML IV SOLN
INTRAVENOUS | Status: DC | PRN
  Administered 2018-01-19: 200 mg via INTRAVENOUS

## 2018-01-19 MED ORDER — SUCCINYLCHOLINE CHLORIDE 200 MG/10ML IV SOSY
PREFILLED_SYRINGE | INTRAVENOUS | Status: DC | PRN
  Administered 2018-01-19: 120 mg via INTRAVENOUS

## 2018-01-19 MED ORDER — KETOROLAC TROMETHAMINE 30 MG/ML IJ SOLN
INTRAMUSCULAR | Status: AC
  Filled 2018-01-19: qty 1

## 2018-01-19 MED ORDER — SEVOFLURANE IN SOLN
RESPIRATORY_TRACT | Status: AC
  Filled 2018-01-19: qty 250

## 2018-01-19 MED ORDER — MEPERIDINE HCL 50 MG/ML IJ SOLN: 6.2500 mg | INTRAMUSCULAR | Status: DC | PRN

## 2018-01-19 MED ORDER — LIDOCAINE HCL (PF) 1 % IJ SOLN
INTRAMUSCULAR | Status: AC
  Filled 2018-01-19: qty 5

## 2018-01-19 MED ORDER — EPHEDRINE SULFATE 50 MG/ML IJ SOLN
INTRAMUSCULAR | Status: DC | PRN
  Administered 2018-01-19: 5 mg via INTRAVENOUS

## 2018-01-19 MED ORDER — HEMOSTATIC AGENTS (NO CHARGE) OPTIME
TOPICAL | Status: DC | PRN
  Administered 2018-01-19: 1 via TOPICAL

## 2018-01-19 MED ORDER — KETOROLAC TROMETHAMINE 30 MG/ML IJ SOLN
INTRAMUSCULAR | Status: DC | PRN
  Administered 2018-01-19: 30 mg via INTRAVENOUS

## 2018-01-19 MED ORDER — LACTATED RINGERS IV SOLN
INTRAVENOUS | Status: DC
  Administered 2018-01-19: 1000 mL via INTRAVENOUS

## 2018-01-19 MED ORDER — OXYCODONE HCL 5 MG PO TABS
5.0000 mg | ORAL_TABLET | Freq: Once | ORAL | Status: AC
  Administered 2018-01-19: 5 mg via ORAL

## 2018-01-19 MED ORDER — BUPIVACAINE HCL (PF) 0.5 % IJ SOLN
INTRAMUSCULAR | Status: AC
  Filled 2018-01-19: qty 30

## 2018-01-19 MED ORDER — OXYCODONE HCL 5 MG PO TABS
ORAL_TABLET | ORAL | Status: AC
  Filled 2018-01-19: qty 1

## 2018-01-19 MED ORDER — MIDAZOLAM HCL 5 MG/5ML IJ SOLN
INTRAMUSCULAR | Status: DC | PRN
  Administered 2018-01-19: 2 mg via INTRAVENOUS

## 2018-01-19 MED ORDER — ONDANSETRON HCL 4 MG/2ML IJ SOLN
INTRAMUSCULAR | Status: AC
  Filled 2018-01-19: qty 2

## 2018-01-19 MED ORDER — SUGAMMADEX SODIUM 200 MG/2ML IV SOLN
INTRAVENOUS | Status: AC
  Filled 2018-01-19: qty 2

## 2018-01-19 MED ORDER — PROPOFOL 10 MG/ML IV BOLUS
INTRAVENOUS | Status: AC
  Filled 2018-01-19: qty 20

## 2018-01-19 MED ORDER — ONDANSETRON HCL 4 MG/2ML IJ SOLN
INTRAMUSCULAR | Status: DC | PRN
  Administered 2018-01-19: 4 mg via INTRAVENOUS

## 2018-01-19 MED ORDER — FENTANYL CITRATE (PF) 100 MCG/2ML IJ SOLN
INTRAMUSCULAR | Status: DC | PRN
  Administered 2018-01-19: 100 ug via INTRAVENOUS

## 2018-01-19 MED ORDER — HYDROMORPHONE HCL 1 MG/ML IJ SOLN
0.2500 mg | INTRAMUSCULAR | Status: DC | PRN
  Administered 2018-01-19: 0.5 mg via INTRAVENOUS
  Filled 2018-01-19: qty 0.5

## 2018-01-19 MED ORDER — DEXAMETHASONE SODIUM PHOSPHATE 4 MG/ML IJ SOLN
INTRAMUSCULAR | Status: DC | PRN
  Administered 2018-01-19: 4 mg via INTRAVENOUS

## 2018-01-19 MED ORDER — OXYCODONE HCL 5 MG PO TABS: 5.0000 mg | ORAL_TABLET | ORAL | 0 refills | Status: DC | PRN

## 2018-01-19 MED ORDER — ONDANSETRON HCL 4 MG/2ML IJ SOLN: 4.0000 mg | Freq: Once | INTRAMUSCULAR | Status: DC | PRN

## 2018-01-19 MED ORDER — EPHEDRINE SULFATE 50 MG/ML IJ SOLN
INTRAMUSCULAR | Status: AC
  Filled 2018-01-19: qty 1

## 2018-01-19 MED ORDER — CIPROFLOXACIN IN D5W 400 MG/200ML IV SOLN
400.0000 mg | INTRAVENOUS | Status: AC
  Administered 2018-01-19: 400 mg via INTRAVENOUS
  Filled 2018-01-19: qty 200

## 2018-01-19 MED ORDER — FENTANYL CITRATE (PF) 100 MCG/2ML IJ SOLN
INTRAMUSCULAR | Status: AC
  Filled 2018-01-19: qty 2

## 2018-01-19 MED ORDER — DEXAMETHASONE SODIUM PHOSPHATE 4 MG/ML IJ SOLN
INTRAMUSCULAR | Status: AC
  Filled 2018-01-19: qty 1

## 2018-01-19 SURGICAL SUPPLY — 42 items
APPLIER CLIP ROT 10 11.4 M/L (STAPLE) ×2
BAG RETRIEVAL 10 (BASKET) ×1
BLADE SURG 15 STRL LF DISP TIS (BLADE) ×1 IMPLANT
BLADE SURG 15 STRL SS (BLADE) ×1
CHLORAPREP W/TINT 26ML (MISCELLANEOUS) ×2 IMPLANT
CLIP APPLIE ROT 10 11.4 M/L (STAPLE) ×1 IMPLANT
CLOTH BEACON ORANGE TIMEOUT ST (SAFETY) ×2 IMPLANT
COVER LIGHT HANDLE STERIS (MISCELLANEOUS) ×4 IMPLANT
DECANTER SPIKE VIAL GLASS SM (MISCELLANEOUS) ×2 IMPLANT
DERMABOND ADVANCED (GAUZE/BANDAGES/DRESSINGS) ×1
DERMABOND ADVANCED .7 DNX12 (GAUZE/BANDAGES/DRESSINGS) ×1 IMPLANT
ELECT REM PT RETURN 9FT ADLT (ELECTROSURGICAL) ×2
ELECTRODE REM PT RTRN 9FT ADLT (ELECTROSURGICAL) ×1 IMPLANT
FILTER SMOKE EVAC LAPAROSHD (FILTER) ×2 IMPLANT
GLOVE BIO SURGEON STRL SZ 6.5 (GLOVE) ×2 IMPLANT
GLOVE BIOGEL PI IND STRL 6.5 (GLOVE) ×1 IMPLANT
GLOVE BIOGEL PI IND STRL 7.0 (GLOVE) ×3 IMPLANT
GLOVE BIOGEL PI INDICATOR 6.5 (GLOVE) ×1
GLOVE BIOGEL PI INDICATOR 7.0 (GLOVE) ×3
GOWN STRL REUS W/TWL LRG LVL3 (GOWN DISPOSABLE) ×6 IMPLANT
HEMOSTAT SNOW SURGICEL 2X4 (HEMOSTASIS) ×2 IMPLANT
INST SET LAPROSCOPIC AP (KITS) ×2 IMPLANT
IV NS IRRIG 3000ML ARTHROMATIC (IV SOLUTION) IMPLANT
KIT TURNOVER KIT A (KITS) ×2 IMPLANT
MANIFOLD NEPTUNE II (INSTRUMENTS) ×2 IMPLANT
NEEDLE INSUFFLATION 14GA 120MM (NEEDLE) ×2 IMPLANT
NS IRRIG 1000ML POUR BTL (IV SOLUTION) ×2 IMPLANT
PACK LAP CHOLE LZT030E (CUSTOM PROCEDURE TRAY) ×2 IMPLANT
PAD ARMBOARD 7.5X6 YLW CONV (MISCELLANEOUS) ×2 IMPLANT
SET BASIN LINEN APH (SET/KITS/TRAYS/PACK) ×2 IMPLANT
SET TUBE IRRIG SUCTION NO TIP (IRRIGATION / IRRIGATOR) IMPLANT
SLEEVE ENDOPATH XCEL 5M (ENDOMECHANICALS) ×2 IMPLANT
SUT MNCRL AB 4-0 PS2 18 (SUTURE) ×2 IMPLANT
SUT VICRYL 0 UR6 27IN ABS (SUTURE) ×2 IMPLANT
SYS BAG RETRIEVAL 10MM (BASKET) ×1
SYSTEM BAG RETRIEVAL 10MM (BASKET) ×1 IMPLANT
TROCAR ENDO BLADELESS 11MM (ENDOMECHANICALS) ×2 IMPLANT
TROCAR XCEL NON-BLD 5MMX100MML (ENDOMECHANICALS) ×2 IMPLANT
TROCAR XCEL UNIV SLVE 11M 100M (ENDOMECHANICALS) ×2 IMPLANT
TUBE CONNECTING 12X1/4 (SUCTIONS) ×2 IMPLANT
TUBING INSUFFLATION (TUBING) ×2 IMPLANT
WARMER LAPAROSCOPE (MISCELLANEOUS) ×2 IMPLANT

## 2018-01-19 NOTE — Anesthesia Procedure Notes (Signed)
Procedure Name: Intubation Date/Time: 01/19/2018 11:24 AM Performed by: Vista LawmanEargle, Johnita Palleschi E, CRNA Pre-anesthesia Checklist: Patient identified, Emergency Drugs available, Suction available and Patient being monitored Patient Re-evaluated:Patient Re-evaluated prior to induction Oxygen Delivery Method: Circle system utilized Preoxygenation: Pre-oxygenation with 100% oxygen Induction Type: IV induction Ventilation: Mask ventilation without difficulty Laryngoscope Size: Glidescope Grade View: Grade I Tube type: Oral Number of attempts: 1 Airway Equipment and Method: Stylet,  Oral airway and Video-laryngoscopy Placement Confirmation: ETT inserted through vocal cords under direct vision,  positive ETCO2 and breath sounds checked- equal and bilateral Secured at: 21 cm Tube secured with: Tape Dental Injury: Teeth and Oropharynx as per pre-operative assessment  Comments: Atraumatic, elective glidescope use. Teeth/lips as pre op

## 2018-01-19 NOTE — Anesthesia Preprocedure Evaluation (Signed)
Anesthesia Evaluation  Patient identified by MRN, date of birth, ID band Patient awake    Reviewed: Allergy & Precautions, H&P , NPO status , Patient's Chart, lab work & pertinent test results  Airway Mallampati: II  TM Distance: >3 FB Neck ROM: full   Comment: Moderately severe TMJ Dental no notable dental hx.    Pulmonary neg pulmonary ROS,    Pulmonary exam normal breath sounds clear to auscultation       Cardiovascular Exercise Tolerance: Good negative cardio ROS   Rhythm:regular Rate:Normal     Neuro/Psych Seizures -, Well Controlled,  negative neurological ROS  negative psych ROS   GI/Hepatic negative GI ROS, Neg liver ROS,   Endo/Other  negative endocrine ROSHypothyroidism Morbid obesity  Renal/GU negative Renal ROS  negative genitourinary   Musculoskeletal   Abdominal   Peds  Hematology negative hematology ROS (+)   Anesthesia Other Findings   Reproductive/Obstetrics negative OB ROS                             Anesthesia Physical Anesthesia Plan  ASA: III  Anesthesia Plan: General   Post-op Pain Management:    Induction:   PONV Risk Score and Plan:   Airway Management Planned:   Additional Equipment:   Intra-op Plan:   Post-operative Plan:   Informed Consent: I have reviewed the patients History and Physical, chart, labs and discussed the procedure including the risks, benefits and alternatives for the proposed anesthesia with the patient or authorized representative who has indicated his/her understanding and acceptance.   Dental Advisory Given  Plan Discussed with: CRNA  Anesthesia Plan Comments:         Anesthesia Quick Evaluation

## 2018-01-19 NOTE — Anesthesia Postprocedure Evaluation (Signed)
Anesthesia Post Note  Patient: Sherri CrawfordKristy C Patel  Procedure(s) Performed: LAPAROSCOPIC CHOLECYSTECTOMY (N/A Abdomen)  Patient location during evaluation: PACU Anesthesia Type: General Level of consciousness: awake and patient cooperative Pain management: pain level controlled Vital Signs Assessment: post-procedure vital signs reviewed and stable Respiratory status: spontaneous breathing, nonlabored ventilation and respiratory function stable Cardiovascular status: blood pressure returned to baseline Postop Assessment: no apparent nausea or vomiting Anesthetic complications: no     Last Vitals:  Vitals:   01/19/18 1045 01/19/18 1100  BP:    Pulse:    Resp: 20 (!) 27  Temp:    SpO2: 100% 99%    Last Pain:  Vitals:   01/19/18 0950  TempSrc: Oral  PainSc: 0-No pain                 Melayna Robarts J

## 2018-01-19 NOTE — Op Note (Signed)
Operative Note   Preoperative Diagnosis: Biliary dyskinesia    Postoperative Diagnosis: Same   Procedure(s) Performed: Laparoscopic cholecystectomy   Surgeon: Lillia AbedLindsay C. Henreitta LeberBridges, MD   Assistants: Franky MachoMark Jenkins, Md    Anesthesia: General endotracheal   Anesthesiologist: Shona NeedlesWynn, Vander M, MD    Specimens: Gallbladder    Estimated Blood Loss: Minimal    Blood Replacement: None    Complications: None    Operative Findings:  Distended gallbladder with bile    Procedure: The patient was taken to the operating room and placed supine. General endotracheal anesthesia was induced. Intravenous antibiotics were administered per protocol. An orogastric tube positioned to decompress the stomach. The abdomen was prepared and draped in the usual sterile fashion.    A supraumbilical incision was made and a Veress technique was utilized to achieve pneumoperitoneum to 15 mmHg with carbon dioxide. A 11 mm optiview port was placed through the supraumbilical region, and a 10 mm 0-degree operative laparoscope was introduced. The area underlying the trocar and Veress needle were inspected and without evidence of injury.  Remaining trocars were placed under direct vision. Two 5 mm ports were placed in the right abdomen, between the anterior axillary and midclavicular line.  A final 11 mm port was placed through the mid-epigastrium, near the falciform ligament.    The gallbladder fundus was elevated cephalad and the infundibulum was retracted to the patient's right. The gallbladder/cystic duct junction was skeletonized. The cystic artery noted in the triangle of Calot and was also skeletonized.  We then continued liberal medial and lateral dissection until the critical view of safety was achieved.    The cystic duct and cystic artery were doubly clipped and divided. The gallbladder was then dissected from the liver bed with electrocautery. The specimen was placed in an Endopouch and was retrieved through the  epigastric site.   Final inspection revealed acceptable hemostasis. Surgical Jamelle HaringSnow was placed in the gallbladder bed. 0 Vicryl fascial sutures were used umbilical port site and the epigastric site was overlying the rib after desufflation. Trocars were removed and pneumoperitoneum was released. Skin incisions were closed with 4-0 Monocryl subcuticular sutures and Dermabond. The patient was awakened from anesthesia and extubated without complication.    Algis GreenhouseLindsay Lacrystal Barbe, MD [facility] [address] [place], [place] [id number] [phone] (office)

## 2018-01-19 NOTE — Interval H&P Note (Signed)
History and Physical Interval Note:  01/19/2018 10:01 AM  Sherri CrawfordKristy C Cedrone  has presented today for surgery, with the diagnosis of biliary dyskinesia  The various methods of treatment have been discussed with the patient and family. After consideration of risks, benefits and other options for treatment, the patient has consented to  Procedure(s): LAPAROSCOPIC CHOLECYSTECTOMY (N/A) as a surgical intervention .  The patient's history has been reviewed, patient examined, no change in status, stable for surgery.  I have reviewed the patient's chart and labs.  Questions were answered to the patient's satisfaction.    No questions. Discussed risk of bleeding, infection, injury to CBD/ other organs, discussed post op expectations.   Lucretia RoersLindsay C Nussen Pullin

## 2018-01-19 NOTE — Discharge Instructions (Signed)
Discharge Instructions: Shower per your regular routine. Take tylenol and ibuprofen as needed for pain control, alternating every 4-6 hours.  Take Roxicodone for breakthrough pain. Take colace for constipation related to narcotic pain medication. Do not pick at the dermabond glue on your incision sites.    Laparoscopic Cholecystectomy, Care After This sheet gives you information about how to care for yourself after your procedure. Your doctor may also give you more specific instructions. If you have problems or questions, contact your doctor. Follow these instructions at home: Care for cuts from surgery (incisions)   Follow instructions from your doctor about how to take care of your cuts from surgery. Make sure you: ? Wash your hands with soap and water before you change your bandage (dressing). If you cannot use soap and water, use hand sanitizer. ? Change your bandage as told by your doctor. ? Leave stitches (sutures), skin glue, or skin tape (adhesive) strips in place. They may need to stay in place for 2 weeks or longer. If tape strips get loose and curl up, you may trim the loose edges. Do not remove tape strips completely unless your doctor says it is okay.  Do not take baths, swim, or use a hot tub until your doctor says it is okay.   Check your surgical cut area every day for signs of infection. Check for: ? More redness, swelling, or pain. ? More fluid or blood. ? Warmth. ? Pus or a bad smell. Activity  Do not drive or use heavy machinery while taking prescription pain medicine.  Do not lift anything that is heavier than 10 lb (4.5 kg) until your doctor says it is okay.  Do not play contact sports until your doctor says it is okay.  Do not drive for 24 hours if you were given a medicine to help you relax (sedative).  Rest as needed. Do not return to work or school until your doctor says it is okay. General instructions  Take over-the-counter and prescription medicines  only as told by your doctor.  To prevent or treat constipation while you are taking prescription pain medicine, your doctor may recommend that you: ? Drink enough fluid to keep your pee (urine) clear or pale yellow. ? Take over-the-counter or prescription medicines. ? Eat foods that are high in fiber, such as fresh fruits and vegetables, whole grains, and beans. ? Limit foods that are high in fat and processed sugars, such as fried and sweet foods. Contact a doctor if:  You develop a rash.  You have more redness, swelling, or pain around your surgical cuts.  You have more fluid or blood coming from your surgical cuts.  Your surgical cuts feel warm to the touch.  You have pus or a bad smell coming from your surgical cuts.  You have a fever.  One or more of your surgical cuts breaks open. Get help right away if:  You have trouble breathing.  You have chest pain.  You have pain that is getting worse in your shoulders.  You faint or feel dizzy when you stand.  You have very bad pain in your belly (abdomen).  You are sick to your stomach (nauseous) for more than one day.  You have throwing up (vomiting) that lasts for more than one day.  You have leg pain. This information is not intended to replace advice given to you by your health care provider. Make sure you discuss any questions you have with your health care provider. Document  Released: 03/03/2008 Document Revised: 12/14/2015 Document Reviewed: 11/11/2015 Elsevier Interactive Patient Education  2018 [employer]     General Anesthesia, Adult, Care After These instructions provide you with information about caring for yourself after your procedure. Your health care provider may also give you more specific instructions. Your treatment has been planned according to current medical practices, but problems sometimes occur. Call your health care provider if you have any problems or questions after your procedure. What can  I expect after the procedure? After the procedure, it is common to have:  Vomiting.  A sore throat.  Mental slowness.  It is common to feel:  Nauseous.  Cold or shivery.  Sleepy.  Tired.  Sore or achy, even in parts of your body where you did not have surgery.  Follow these instructions at home: For at least 24 hours after the procedure:  Do not: ? Participate in activities where you could fall or become injured. ? Drive. ? Use heavy machinery. ? Drink alcohol. ? Take sleeping pills or medicines that cause drowsiness. ? Make important decisions or sign legal documents. ? Take care of children on your own.  Rest. Eating and drinking  If you vomit, drink water, juice, or soup when you can drink without vomiting.  Drink enough fluid to keep your urine clear or pale yellow.  Make sure you have little or no nausea before eating solid foods.  Follow the diet recommended by your health care provider. General instructions  Have a responsible adult stay with you until you are awake and alert.  Return to your normal activities as told by your health care provider. Ask your health care provider what activities are safe for you.  Take over-the-counter and prescription medicines only as told by your health care provider.  If you smoke, do not smoke without supervision.  Keep all follow-up visits as told by your health care provider. This is important. Contact a health care provider if:  You continue to have nausea or vomiting at home, and medicines are not helpful.  You cannot drink fluids or start eating again.  You cannot urinate after 8-12 hours.  You develop a skin rash.  You have fever.  You have increasing redness at the site of your procedure. Get help right away if:  You have difficulty breathing.  You have chest pain.  You have unexpected bleeding.  You feel that you are having a life-threatening or urgent problem. This information is not  intended to replace advice given to you by your health care provider. Make sure you discuss any questions you have with your health care provider. Document Released: 08/31/2000 Document Revised: 10/28/2015 Document Reviewed: 05/09/2015 Elsevier Interactive Patient Education  [employer]2018 Elsevier Inc.

## 2018-01-19 NOTE — Transfer of Care (Signed)
Immediate Anesthesia Transfer of Care Note  Patient: Sherri CrawfordKristy C Patel  Procedure(s) Performed: LAPAROSCOPIC CHOLECYSTECTOMY (N/A Abdomen)  Patient Location: PACU  Anesthesia Type:General  Level of Consciousness: awake, alert , oriented and patient cooperative  Airway & Oxygen Therapy: Patient Spontanous Breathing and Patient connected to nasal cannula oxygen  Post-op Assessment: Report given to RN and Post -op Vital signs reviewed and stable  Post vital signs: Reviewed and stable  Last Vitals:  Vitals Value Taken Time  BP    Temp    Pulse 93 01/19/2018 12:25 PM  Resp 18 01/19/2018 12:25 PM  SpO2 96 % 01/19/2018 12:25 PM  Vitals shown include unvalidated device data.  Last Pain:  Vitals:   01/19/18 0950  TempSrc: Oral  PainSc: 0-No pain      Patients Stated Pain Goal: 8 (01/19/18 0950)  Complications: No apparent anesthesia complications

## 2018-01-20 ENCOUNTER — Encounter (HOSPITAL_COMMUNITY): Payer: Self-pay | Admitting: General Surgery

## 2018-02-01 ENCOUNTER — Encounter: Payer: Self-pay | Admitting: General Surgery

## 2018-02-01 ENCOUNTER — Ambulatory Visit (INDEPENDENT_AMBULATORY_CARE_PROVIDER_SITE_OTHER): Payer: Self-pay | Admitting: General Surgery

## 2018-02-01 VITALS — BP 128/76 | HR 78 | Temp 97.7°F | Resp 18 | Wt 250.0 lb

## 2018-02-01 DIAGNOSIS — Z09 Encounter for follow-up examination after completed treatment for conditions other than malignant neoplasm: Secondary | ICD-10-CM

## 2018-02-01 NOTE — Progress Notes (Signed)
Subjective:     Sherri CrawfordKristy C Haberer  Status post laparoscopic cholecystectomy.  Doing well.  Denies any nausea or abdominal pain.  Is pleased with results. Objective:    BP 128/76 (BP Location: Left Arm, Patient Position: Sitting, Cuff Size: Large)   Pulse 78   Temp 97.7 F (36.5 C) (Temporal)   Resp 18   Wt 250 lb (113.4 kg)   BMI 43.59 kg/m   General:  alert, cooperative and no distress  Abdomen soft, incisions healing well. Final pathology consistent with diagnosis.     Assessment:    Doing well postoperatively.    Plan:   May return to work without restrictions today.  Follow-up as needed.

## 2018-02-22 DIAGNOSIS — Z3042 Encounter for surveillance of injectable contraceptive: Secondary | ICD-10-CM | POA: Diagnosis not present

## 2018-06-27 ENCOUNTER — Other Ambulatory Visit: Payer: Self-pay | Admitting: *Deleted

## 2018-06-27 MED ORDER — LEVOTHYROXINE SODIUM 150 MCG PO TABS
150.0000 ug | ORAL_TABLET | Freq: Every day | ORAL | 0 refills | Status: DC
Start: 2018-06-27 — End: 2018-08-01

## 2018-08-01 ENCOUNTER — Other Ambulatory Visit: Payer: Self-pay | Admitting: Family Medicine

## 2018-08-01 MED ORDER — LEVOTHYROXINE SODIUM 150 MCG PO TABS: 150.0000 ug | ORAL_TABLET | Freq: Every day | ORAL | 0 refills | Status: DC

## 2018-08-01 NOTE — Telephone Encounter (Signed)
Pt aware refill sent to [facility] 

## 2018-08-05 ENCOUNTER — Ambulatory Visit: Payer: BLUE CROSS/BLUE SHIELD | Admitting: Family Medicine

## 2018-08-05 VITALS — BP 118/72 | HR 77 | Ht 64.0 in | Wt 261.0 lb

## 2018-08-05 DIAGNOSIS — E039 Hypothyroidism, unspecified: Secondary | ICD-10-CM | POA: Diagnosis not present

## 2018-08-05 DIAGNOSIS — E538 Deficiency of other specified B group vitamins: Secondary | ICD-10-CM

## 2018-08-05 DIAGNOSIS — E559 Vitamin D deficiency, unspecified: Secondary | ICD-10-CM

## 2018-08-05 DIAGNOSIS — Z5181 Encounter for therapeutic drug level monitoring: Secondary | ICD-10-CM

## 2018-08-05 DIAGNOSIS — J0100 Acute maxillary sinusitis, unspecified: Secondary | ICD-10-CM | POA: Diagnosis not present

## 2018-08-05 DIAGNOSIS — G40109 Localization-related (focal) (partial) symptomatic epilepsy and epileptic syndromes with simple partial seizures, not intractable, without status epilepticus: Secondary | ICD-10-CM | POA: Diagnosis not present

## 2018-08-05 MED ORDER — PREDNISONE 10 MG (21) PO TBPK: ORAL_TABLET | ORAL | 0 refills | Status: DC

## 2018-08-05 MED ORDER — CEFDINIR 300 MG PO CAPS: 300.0000 mg | ORAL_CAPSULE | Freq: Two times a day (BID) | ORAL | 0 refills | Status: DC

## 2018-08-05 NOTE — Progress Notes (Signed)
Subjective: CC: Hypothyroidism, sinusitis PCP: [name], DO RDE:YCXKGY Sherri Patel is a 43 y.o. female presenting to clinic today for:  1.  Hypothyroidism History: Patient has been on thyroid supplementation since age 30.  She developed thyroid disorder after the birth of her daughter.  She has had a history of normal ultrasound previously.  No known history of radiation or surgery to the neck.  Patient reports compliance with Synthroid 150 mcg daily.  She does report chronic fatigue and difficulty with weight.  She has had hair thinning since diagnosis with thyroid disorder.  She denies any change in voice, difficulty swallowing, diarrhea.  She has had some palpitations.  She is also had some visual issues but this is secondary to seizure disorder.  She follows up with her eye doctor regularly.  2.  Sinusitis Patient reports a 3-day history of left-sided sinus pressure and left-sided ear pain.  She reports associated opaque, thick nasal discharge.  No fevers.  She has been using Tylenol for symptoms with little improvement.  3.  Seizure disorder Patient is followed by Dr. Wynona Neat in [place] for seizure disorder.  She takes Keppra twice daily.  She notes that seizures are precipitated by flashing lights.  She has not had lab draws in some time.   ROS: Per HPI  Allergies  Allergen Reactions  . Penicillins     Upset stomach Has patient had a PCN reaction causing immediate rash, facial/tongue/throat swelling, SOB or lightheadedness with hypotension: No Has patient had a PCN reaction causing severe rash involving mucus membranes or skin necrosis: No Has patient had a PCN reaction that required hospitalization: No Has patient had a PCN reaction occurring within the last 10 years: Yes If all of the above answers are "NO", then may proceed with Cephalosporin use.    Past Medical History:  Diagnosis Date  . Allergy   . Dyslipidemia   . Epilepsy ([place])   . Hyperlipidemia    . Hypothyroidism   . Overweight(278.02)   . Seizures ([place])   . SOB (shortness of breath)   . Stress     Current Outpatient Medications:  .  acetaminophen (TYLENOL) 500 MG tablet, Take 1,000 mg by mouth daily as needed for moderate pain or headache., Disp: , Rfl:  .  ibuprofen (ADVIL,MOTRIN) 200 MG tablet, Take 400 mg by mouth daily as needed for headache or moderate pain., Disp: , Rfl:  .  lamoTRIgine (LAMICTAL) 150 MG tablet, Take 150 mg by mouth 2 (two) times daily. , Disp: , Rfl:  .  levETIRAcetam (KEPPRA) 750 MG tablet, Take 750 mg by mouth 2 (two) times daily., Disp: , Rfl:  .  levothyroxine (SYNTHROID, LEVOTHROID) 150 MCG tablet, Take 1 tablet (150 mcg total) by mouth daily. (Needs to be seen before next refill), Disp: 30 tablet, Rfl: 0 .  medroxyPROGESTERone (DEPO-PROVERA) 150 MG/ML injection, Inject 150 mg into the muscle every 3 (three) months.  , Disp: , Rfl:  .  Multiple Vitamin (MULTIVITAMIN) tablet, Take 1 tablet by mouth daily.  , Disp: , Rfl:  .  pantoprazole (PROTONIX) 40 MG tablet, Take 1 tablet (40 mg total) by mouth 2 (two) times daily., Disp: 60 tablet, Rfl: 1 Social History   Socioeconomic History  . Marital status: Married    Spouse name: Not on file  . Number of children: 3  . Years of education: Not on file  . Highest education level: Not on file  Occupational History    Employer: UNEMPLOYED  Social Needs  . Financial resource strain: Not on file  . Food insecurity:    Worry: Not on file    Inability: Not on file  . Transportation needs:    Medical: Not on file    Non-medical: Not on file  Tobacco Use  . Smoking status: Never Smoker  . Smokeless tobacco: Never Used  Substance and Sexual Activity  . Alcohol use: No  . Drug use: No  . Sexual activity: Yes    Birth control/protection: Injection  Lifestyle  . Physical activity:    Days per week: Not on file    Minutes per session: Not on file  . Stress: Not on file  Relationships  . Social  connections:    Talks on phone: Not on file    Gets together: Not on file    Attends religious service: Not on file    Active member of club or organization: Not on file    Attends meetings of clubs or organizations: Not on file    Relationship status: Not on file  . Intimate partner violence:    Fear of current or ex partner: Not on file    Emotionally abused: Not on file    Physically abused: Not on file    Forced sexual activity: Not on file  Other Topics Concern  . Not on file  Social History Narrative  . Not on file   Family History  Problem Relation Age of Onset  . COPD Mother   . Cancer Mother   . Diabetes Mother   . Depression Mother   . Bipolar disorder Mother   . COPD Father   . Cancer Father        prostate  . Hypertension Father     Objective: Office vital signs reviewed. BP 118/72   Pulse 77   Ht _0  (1.626 m)   Wt 261 lb (118.4 kg)   BMI 44.80 kg/m   Physical Examination:  General: Awake, alert, obese. No acute distress HEENT: Normal    Neck: No masses palpated. No lymphadenopathy; thyroid full but no overt goiter.  No palpable thyroid nodules.    Ears: Tympanic membranes intact, normal light reflex, mild erythema noted at the left base of the tympanic membrane with mild bulging.  She has a visible effusion behind the left TM but this is nonpurulent.    Eyes: PERRLA, extraocular membranes intact, sclera white    Nose: nasal turbinates moist, nasal discharge present    Throat: moist mucus membranes, no erythema, no tonsillar exudate.  Airway is patent Cardio: regular rate and rhythm, S1S2 heard, no murmurs appreciated Pulm: clear to auscultation bilaterally, no wheezes, rhonchi or rales; normal work of breathing on room air Extremities: warm, well perfused, No edema, cyanosis or clubbing; +2 pulses bilaterally Skin: dry; intact; no rashes or lesions; normal temperature Neuro: No tremor  Assessment/ Plan: 43 y.o. female   1. Hypothyroidism,  unspecified type Check thyroid panel.  Refills of Synthroid pending lab result.  She has chronic fatigue.  Unsure if this is related to her thyroid disorder or other underlying disorder. - Thyroid Panel With TSH  2. Partial symptomatic epilepsy with simple partial seizures, not intractable, without status epilepticus ([place]) Followed by neurology.  Check CMP and CBC given use of Keppra - CMP14+EGFR - CBC  3. Medication monitoring encounter - CMP14+EGFR - CBC  4. Acute non-recurrent maxillary sinusitis We will treat empirically with oral Omnicef and prednisone Dosepak since this is what has  worked for her in the past.  We discussed reasons for return.  She will follow-up PRN - cefdinir (OMNICEF) 300 MG capsule; Take 1 capsule (300 mg total) by mouth 2 (two) times daily. 1 po BID  Dispense: 20 capsule; Refill: 0 - predniSONE (STERAPRED UNI-PAK 21 TAB) 10 MG (21) TBPK tablet; As directed x 6 days  Dispense: 21 tablet; Refill: 0  5. Vitamin D deficiency Has not been checked in a while.  Check vitamin D level - VITAMIN D 25 Hydroxy (Vit-D Deficiency, Fractures)  6. B12 deficiency Check vitamin B12 given chronic fatigue.  Previously needed B12 injections but has not had them in a while. - Vitamin B12   Orders Placed This Encounter  Procedures  . Thyroid Panel With TSH  . CMP14+EGFR  . CBC  . Vitamin B12  . VITAMIN D 25 Hydroxy (Vit-D Deficiency, Fractures)   Meds ordered this encounter  Medications  . cefdinir (OMNICEF) 300 MG capsule    Sig: Take 1 capsule (300 mg total) by mouth 2 (two) times daily. 1 po BID    Dispense:  20 capsule    Refill:  0  . predniSONE (STERAPRED UNI-PAK 21 TAB) 10 MG (21) TBPK tablet    Sig: As directed x 6 days    Dispense:  21 tablet    Refill:  0     Sherri Pasquini Windell Moulding, DO [place] [phone]

## 2018-08-05 NOTE — Patient Instructions (Signed)

## 2018-08-06 LAB — CMP14+EGFR
A/G RATIO: 2.2 (ref 1.2–2.2)
ALT: 22 IU/L (ref 0–32)
AST: 17 IU/L (ref 0–40)
Albumin: 4.3 g/dL (ref 3.8–4.8)
Alkaline Phosphatase: 86 IU/L (ref 39–117)
BUN/Creatinine Ratio: 10 (ref 9–23)
BUN: 9 mg/dL (ref 6–24)
CALCIUM: 9.3 mg/dL (ref 8.7–10.2)
CHLORIDE: 107 mmol/L — AB (ref 96–106)
CO2: 22 mmol/L (ref 20–29)
Creatinine, Ser: 0.88 mg/dL (ref 0.57–1.00)
GFR, EST AFRICAN AMERICAN: 94 mL/min/{1.73_m2} (ref >59)
GFR, EST NON AFRICAN AMERICAN: 81 mL/min/{1.73_m2} (ref >59)
GLUCOSE: 97 mg/dL (ref 65–99)
Globulin, Total: 2 g/dL (ref 1.5–4.5)
Potassium: 4.1 mmol/L (ref 3.5–5.2)
Sodium: 141 mmol/L (ref 134–144)
Total Protein: 6.3 g/dL (ref 6.0–8.5)

## 2018-08-06 LAB — THYROID PANEL WITH TSH
FREE THYROXINE INDEX: 2.1 (ref 1.2–4.9)
T3 UPTAKE RATIO: 25 % (ref 24–39)
T4, Total: 8.4 ug/dL (ref 4.5–12.0)
TSH: 3.94 u[IU]/mL (ref 0.450–4.500)

## 2018-08-06 LAB — CBC
HEMATOCRIT: 38.5 % (ref 34.0–46.6)
HEMOGLOBIN: 13.4 g/dL (ref 11.1–15.9)
MCH: 29.5 pg (ref 26.6–33.0)
MCHC: 34.8 g/dL (ref 31.5–35.7)
MCV: 85 fL (ref 79–97)
Platelets: 327 10*3/uL (ref 150–450)
RBC: 4.55 x10E6/uL (ref 3.77–5.28)
RDW: 12.1 % (ref 11.7–15.4)
WBC: 8.8 10*3/uL (ref 3.4–10.8)

## 2018-08-06 LAB — VITAMIN D 25 HYDROXY (VIT D DEFICIENCY, FRACTURES): Vit D, 25-Hydroxy: 22.9 ng/mL — ABNORMAL LOW (ref 30.0–100.0)

## 2018-08-06 LAB — VITAMIN B12: Vitamin B-12: 105 pg/mL — ABNORMAL LOW (ref 232–1245)

## 2018-08-22 ENCOUNTER — Other Ambulatory Visit: Payer: Self-pay | Admitting: Family Medicine

## 2019-02-03 ENCOUNTER — Ambulatory Visit: Payer: Managed Care, Other (non HMO) | Admitting: Family Medicine

## 2019-08-23 DIAGNOSIS — Z3042 Encounter for surveillance of injectable contraceptive: Secondary | ICD-10-CM | POA: Diagnosis not present

## 2019-08-25 ENCOUNTER — Other Ambulatory Visit: Payer: Self-pay | Admitting: Family Medicine

## 2019-09-05 ENCOUNTER — Other Ambulatory Visit: Payer: Self-pay

## 2019-09-05 ENCOUNTER — Encounter: Payer: Self-pay | Admitting: Family Medicine

## 2019-09-05 ENCOUNTER — Ambulatory Visit: Payer: Managed Care, Other (non HMO) | Admitting: Family Medicine

## 2019-09-05 VITALS — BP 124/78 | HR 83 | Temp 99.4°F | Ht 64.0 in | Wt 277.0 lb

## 2019-09-05 DIAGNOSIS — E559 Vitamin D deficiency, unspecified: Secondary | ICD-10-CM

## 2019-09-05 DIAGNOSIS — Z5181 Encounter for therapeutic drug level monitoring: Secondary | ICD-10-CM

## 2019-09-05 DIAGNOSIS — G40109 Localization-related (focal) (partial) symptomatic epilepsy and epileptic syndromes with simple partial seizures, not intractable, without status epilepticus: Secondary | ICD-10-CM

## 2019-09-05 DIAGNOSIS — E538 Deficiency of other specified B group vitamins: Secondary | ICD-10-CM | POA: Diagnosis not present

## 2019-09-05 DIAGNOSIS — E034 Atrophy of thyroid (acquired): Secondary | ICD-10-CM

## 2019-09-05 MED ORDER — CYANOCOBALAMIN 1000 MCG/ML IJ SOLN
1000.0000 ug | Freq: Once | INTRAMUSCULAR | Status: AC
  Administered 2019-09-05: 1000 ug via INTRAMUSCULAR

## 2019-09-05 NOTE — Patient Instructions (Signed)
[facility] have some amazing boxes!  You had labs performed today.  You will be contacted with the results of the labs once they are available, usually in the next 3 business days for routine lab work.  If you have an active my chart account, they will be released to your MyChart.  If you prefer to have these labs released to you via telephone, please let us know.  If you had a pap smear or biopsy performed, expect to be contacted in about 7-10 days.

## 2019-09-05 NOTE — Progress Notes (Signed)
Subjective: CC: Hypothyroidism, Vitamin deficiency PCP: [name], DO Sherri Patel is a 44 y.o. female presenting to clinic today for:  1.  Hypothyroidism History: Patient has been on thyroid supplementation since age 58.  She developed thyroid disorder after the birth of her daughter.  She has had a history of normal ultrasound previously.  No known history of radiation or surgery to the neck.  Patient ran out of her medication about 1 week ago.  She notes that she is been feeling hypothyroid for a while now.  She is gained quite a bit of weight but notes that her right knee has been acting out, she had a replacement many years ago.  She also lost her father this past year.  2.  Seizure disorder Patient is followed by Dr. Wynona Neat in [place] for seizure disorder.  She takes Keppra twice daily and also takes Lamictal twice daily.  She had a good checkup in August from him.  Denies any significant seizure-like activity.  3. Vitamin B12/ D deficiency Unfortunately was lost to follow-up due to COVID-19 last year.  She does report fatigue.  She is not currently on any vitamin supplementation.  Does not report any proprioception issues.  ROS: Per HPI  Allergies  Allergen Reactions  . Penicillins     Upset stomach Has patient had a PCN reaction causing immediate rash, facial/tongue/throat swelling, SOB or lightheadedness with hypotension: No Has patient had a PCN reaction causing severe rash involving mucus membranes or skin necrosis: No Has patient had a PCN reaction that required hospitalization: No Has patient had a PCN reaction occurring within the last 10 years: Yes If all of the above answers are "NO", then may proceed with Cephalosporin use.    Past Medical History:  Diagnosis Date  . Allergy   . Dyslipidemia   . Epilepsy ([place])   . Hyperlipidemia   . Hypothyroidism   . Overweight(278.02)   . Seizures ([place])   . SOB (shortness of breath)   . Stress       Current Outpatient Medications:  .  acetaminophen (TYLENOL) 500 MG tablet, Take 1,000 mg by mouth daily as needed for moderate pain or headache., Disp: , Rfl:  .  cefdinir (OMNICEF) 300 MG capsule, Take 1 capsule (300 mg total) by mouth 2 (two) times daily. 1 po BID, Disp: 20 capsule, Rfl: 0 .  ibuprofen (ADVIL,MOTRIN) 200 MG tablet, Take 400 mg by mouth daily as needed for headache or moderate pain., Disp: , Rfl:  .  lamoTRIgine (LAMICTAL) 150 MG tablet, Take 150 mg by mouth 2 (two) times daily. , Disp: , Rfl:  .  levETIRAcetam (KEPPRA) 750 MG tablet, Take 750 mg by mouth 2 (two) times daily., Disp: , Rfl:  .  levothyroxine (SYNTHROID, LEVOTHROID) 150 MCG tablet, TAKE 1 TABLET BY MOUTH ONCE DAILY (NEEDS  TO  BE  SEEN  BEFORE  REFILLS), Disp: 90 tablet, Rfl: 3 .  medroxyPROGESTERone (DEPO-PROVERA) 150 MG/ML injection, Inject 150 mg into the muscle every 3 (three) months.  , Disp: , Rfl:  .  Multiple Vitamin (MULTIVITAMIN) tablet, Take 1 tablet by mouth daily.  , Disp: , Rfl:  .  pantoprazole (PROTONIX) 40 MG tablet, Take 1 tablet (40 mg total) by mouth 2 (two) times daily., Disp: 60 tablet, Rfl: 1 .  predniSONE (STERAPRED UNI-PAK 21 TAB) 10 MG (21) TBPK tablet, As directed x 6 days, Disp: 21 tablet, Rfl: 0 Social History   Socioeconomic History  .  Marital status: Married    Spouse name: Not on file  . Number of children: 3  . Years of education: Not on file  . Highest education level: Not on file  Occupational History    Employer: UNEMPLOYED  Tobacco Use  . Smoking status: Never Smoker  . Smokeless tobacco: Never Used  Substance and Sexual Activity  . Alcohol use: No  . Drug use: No  . Sexual activity: Yes    Birth control/protection: Injection  Other Topics Concern  . Not on file  Social History Narrative  . Not on file   Social Determinants of Health   Financial Resource Strain:   . Difficulty of Paying Living Expenses:   Food Insecurity:   . Worried About Ship broker in the Last Year:   . Arboriculturist in the Last Year:   Transportation Needs:   . Film/video editor (Medical):   Marland Kitchen Lack of Transportation (Non-Medical):   Physical Activity:   . Days of Exercise per Week:   . Minutes of Exercise per Session:   Stress:   . Feeling of Stress :   Social Connections:   . Frequency of Communication with Friends and Family:   . Frequency of Social Gatherings with Friends and Family:   . Attends Religious Services:   . Active Member of Clubs or Organizations:   . Attends Archivist Meetings:   Marland Kitchen Marital Status:   Intimate Partner Violence:   . Fear of Current or Ex-Partner:   . Emotionally Abused:   Marland Kitchen Physically Abused:   . Sexually Abused:    Family History  Problem Relation Age of Onset  . COPD Mother   . Cancer Mother   . Diabetes Mother   . Depression Mother   . Bipolar disorder Mother   . COPD Father   . Cancer Father        prostate  . Hypertension Father     Objective: Office vital signs reviewed. BP 124/78   Pulse 83   Temp 99.4 F (37.4 C) (Temporal)   Ht '5\' 4"'$  (1.626 m)   Wt 277 lb (125.6 kg)   LMP  (Approximate)   SpO2 97%   BMI 47.55 kg/m   Physical Examination:  General: Awake, alert, obese. No acute distress HEENT: Normal    Neck: No masses palpated. No lymphadenopathy; no goiter.  No palpable thyroid nodules.    Ears: Tympanic membranes intact, normal light reflex, mild erythema noted at the left base of the tympanic membrane with mild bulging.  She has a visible effusion behind the left TM but this is nonpurulent.    Eyes: extraocular membranes intact, sclera white; no exophthalmos    Nose: nasal turbinates moist     Throat: moist mucus membranes, no erythema, no tonsillar exudate.  Airway is patent Cardio: regular rate and rhythm, S1S2 heard, no murmurs appreciated Pulm: clear to auscultation bilaterally, no wheezes, rhonchi or rales; normal work of breathing on room air Extremities: warm,  well perfused, No edema, cyanosis or clubbing; +2 pulses bilaterally Skin: dry; intact; no rashes or lesions; normal temperature Neuro: No tremor  Assessment/ Plan: 44 y.o. female   1. Hypothyroidism due to acquired atrophy of thyroid Weight gain, fatigue.  Check thyroid panel.  May need to consider increasing Synthroid to 175 mcg with 6 weeks recheck - Thyroid Panel With TSH - Lipid Panel  2. Partial symptomatic epilepsy with simple partial seizures, not intractable, without status epilepticus (  [place]) Asymptomatic.  Check labs - CMP14+EGFR - CBC  3. Vitamin D deficiency - VITAMIN D 25 Hydroxy (Vit-D Deficiency, Fractures)  4. B12 deficiency Fatigue present.  She will be given a vitamin B12 injection today.  Labs obtained prior to injection - Vitamin B12 - cyanocobalamin ((VITAMIN B-12)) injection 1,000 mcg  5. Medication monitoring encounter - CMP14+EGFR - CBC    No orders of the defined types were placed in this encounter.  No orders of the defined types were placed in this encounter.    [name], DO [place] [phone]

## 2019-09-06 ENCOUNTER — Other Ambulatory Visit: Payer: Self-pay | Admitting: Family Medicine

## 2019-09-06 DIAGNOSIS — E538 Deficiency of other specified B group vitamins: Secondary | ICD-10-CM

## 2019-09-06 DIAGNOSIS — E034 Atrophy of thyroid (acquired): Secondary | ICD-10-CM

## 2019-09-06 LAB — CMP14+EGFR
ALT: 19 IU/L (ref 0–32)
AST: 14 IU/L (ref 0–40)
Albumin/Globulin Ratio: 1.7 (ref 1.2–2.2)
Albumin: 4.1 g/dL (ref 3.8–4.8)
Alkaline Phosphatase: 83 IU/L (ref 39–117)
BUN/Creatinine Ratio: 11 (ref 9–23)
BUN: 9 mg/dL (ref 6–24)
Bilirubin Total: 0.4 mg/dL (ref 0.0–1.2)
CO2: 19 mmol/L — ABNORMAL LOW (ref 20–29)
Calcium: 8.9 mg/dL (ref 8.7–10.2)
Chloride: 105 mmol/L (ref 96–106)
Creatinine, Ser: 0.8 mg/dL (ref 0.57–1.00)
GFR calc Af Amer: 104 mL/min/{1.73_m2} (ref >59)
GFR calc non Af Amer: 91 mL/min/{1.73_m2} (ref >59)
Globulin, Total: 2.4 g/dL (ref 1.5–4.5)
Glucose: 106 mg/dL — ABNORMAL HIGH (ref 65–99)
Potassium: 4.1 mmol/L (ref 3.5–5.2)
Sodium: 138 mmol/L (ref 134–144)
Total Protein: 6.5 g/dL (ref 6.0–8.5)

## 2019-09-06 LAB — CBC
Hematocrit: 42.1 % (ref 34.0–46.6)
Hemoglobin: 14 g/dL (ref 11.1–15.9)
MCH: 30.6 pg (ref 26.6–33.0)
MCHC: 33.3 g/dL (ref 31.5–35.7)
MCV: 92 fL (ref 79–97)
Platelets: 319 10*3/uL (ref 150–450)
RBC: 4.57 x10E6/uL (ref 3.77–5.28)
RDW: 12.7 % (ref 11.7–15.4)
WBC: 8.7 10*3/uL (ref 3.4–10.8)

## 2019-09-06 LAB — LIPID PANEL
Chol/HDL Ratio: 2.9 ratio (ref 0.0–4.4)
Cholesterol, Total: 165 mg/dL (ref 100–199)
HDL: 56 mg/dL (ref >39)
LDL Chol Calc (NIH): 94 mg/dL (ref 0–99)
Triglycerides: 81 mg/dL (ref 0–149)
VLDL Cholesterol Cal: 15 mg/dL (ref 5–40)

## 2019-09-06 LAB — THYROID PANEL WITH TSH
Free Thyroxine Index: 1.2 (ref 1.2–4.9)
T3 Uptake Ratio: 22 % — ABNORMAL LOW (ref 24–39)
T4, Total: 5.6 ug/dL (ref 4.5–12.0)
TSH: 8.25 u[IU]/mL — ABNORMAL HIGH (ref 0.450–4.500)

## 2019-09-06 LAB — VITAMIN B12: Vitamin B-12: 53 pg/mL — ABNORMAL LOW (ref 232–1245)

## 2019-09-06 LAB — VITAMIN D 25 HYDROXY (VIT D DEFICIENCY, FRACTURES): Vit D, 25-Hydroxy: 24.2 ng/mL — ABNORMAL LOW (ref 30.0–100.0)

## 2019-09-06 MED ORDER — LEVOTHYROXINE SODIUM 150 MCG PO TABS: ORAL_TABLET | ORAL | 1 refills | Status: DC

## 2019-09-07 ENCOUNTER — Encounter: Payer: Self-pay | Admitting: Family Medicine

## 2019-09-07 ENCOUNTER — Other Ambulatory Visit: Payer: Self-pay | Admitting: Family Medicine

## 2019-09-07 MED ORDER — CEFDINIR 300 MG PO CAPS: 300.0000 mg | ORAL_CAPSULE | Freq: Two times a day (BID) | ORAL | 0 refills | Status: DC

## 2019-09-07 MED ORDER — LEVOTHYROXINE SODIUM 150 MCG PO TABS: ORAL_TABLET | ORAL | 0 refills | Status: DC

## 2019-11-08 DIAGNOSIS — Z3009 Encounter for other general counseling and advice on contraception: Secondary | ICD-10-CM | POA: Diagnosis not present

## 2019-11-08 DIAGNOSIS — Z3042 Encounter for surveillance of injectable contraceptive: Secondary | ICD-10-CM | POA: Diagnosis not present

## 2019-11-10 ENCOUNTER — Other Ambulatory Visit: Payer: BC Managed Care – PPO

## 2019-11-10 ENCOUNTER — Other Ambulatory Visit: Payer: Self-pay

## 2019-11-10 DIAGNOSIS — E034 Atrophy of thyroid (acquired): Secondary | ICD-10-CM | POA: Diagnosis not present

## 2019-11-10 DIAGNOSIS — E538 Deficiency of other specified B group vitamins: Secondary | ICD-10-CM

## 2019-11-11 LAB — THYROID PANEL WITH TSH
Free Thyroxine Index: 2.6 (ref 1.2–4.9)
T3 Uptake Ratio: 27 % (ref 24–39)
T4, Total: 9.5 ug/dL (ref 4.5–12.0)
TSH: 1.57 u[IU]/mL (ref 0.450–4.500)

## 2019-11-11 LAB — VITAMIN B12: Vitamin B-12: 306 pg/mL (ref 232–1245)

## 2019-11-15 ENCOUNTER — Other Ambulatory Visit: Payer: Self-pay | Admitting: Family Medicine

## 2019-11-15 MED ORDER — LEVOTHYROXINE SODIUM 150 MCG PO TABS: ORAL_TABLET | ORAL | 0 refills | Status: DC

## 2020-01-24 DIAGNOSIS — Z3042 Encounter for surveillance of injectable contraceptive: Secondary | ICD-10-CM | POA: Diagnosis not present

## 2020-03-09 ENCOUNTER — Other Ambulatory Visit: Payer: Self-pay | Admitting: Family Medicine

## 2020-04-10 DIAGNOSIS — Z3042 Encounter for surveillance of injectable contraceptive: Secondary | ICD-10-CM | POA: Diagnosis not present

## 2020-05-29 IMAGING — US US ABDOMEN LIMITED
1 series · 14 of 25 positions shown · non-contrast
Comparison: None.

CLINICAL DATA: Upper abdominal pain

EXAM:
ULTRASOUND ABDOMEN LIMITED RIGHT UPPER QUADRANT

[Series 1: us abdomen limited · 0.24mm/px · 14 of 49 slices shown]
[im 1/49]
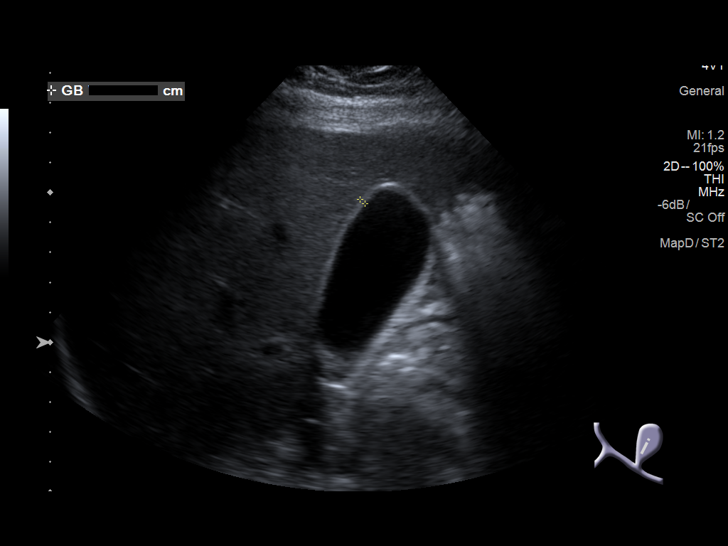
[im 5/49]
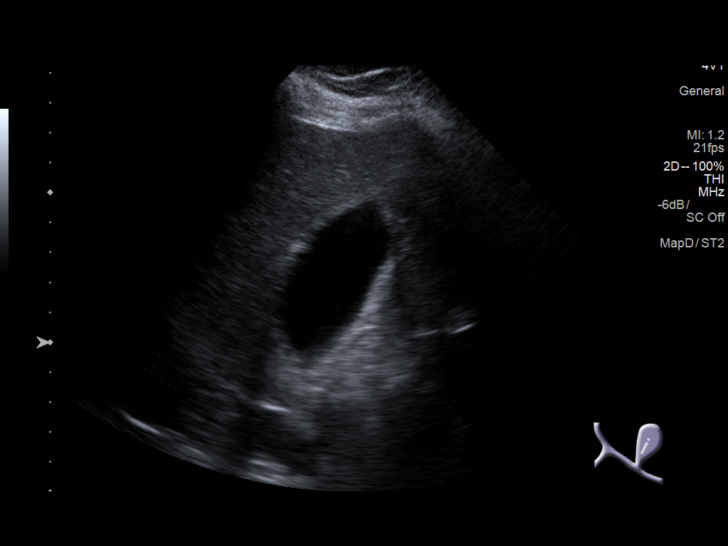
[im 9/49]
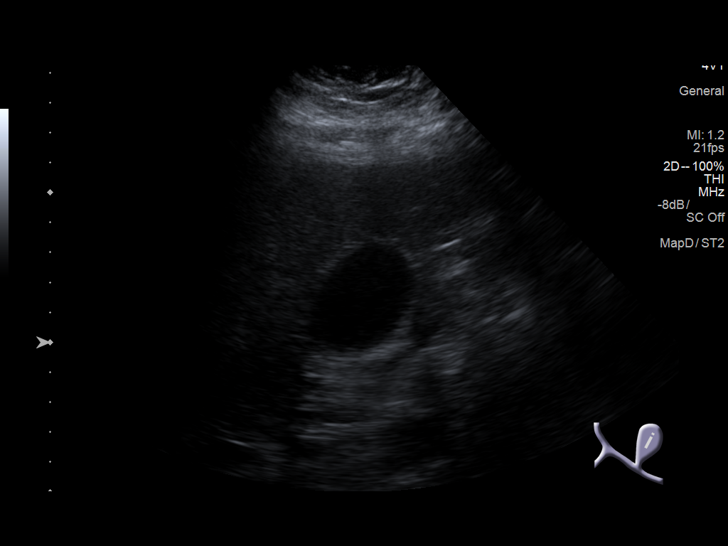
[im 13/49]
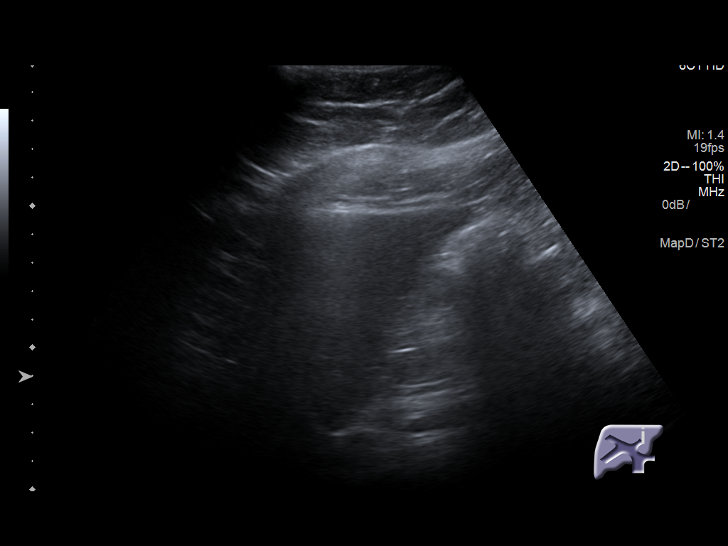
[im 17/49]
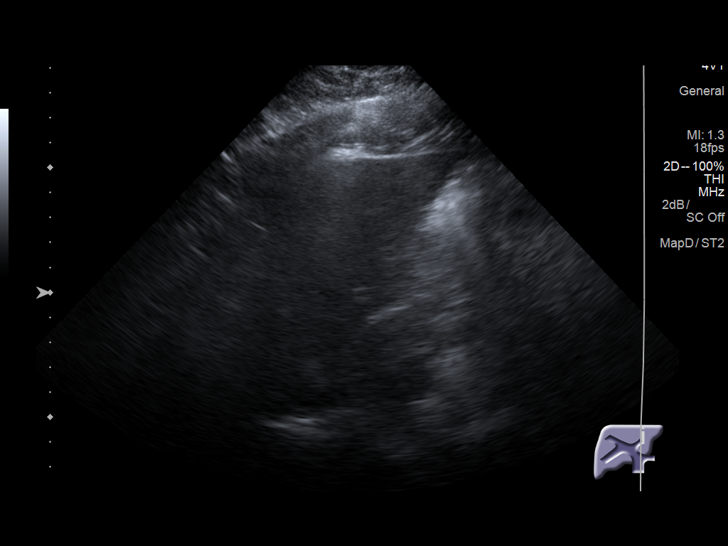
[im 19/49]
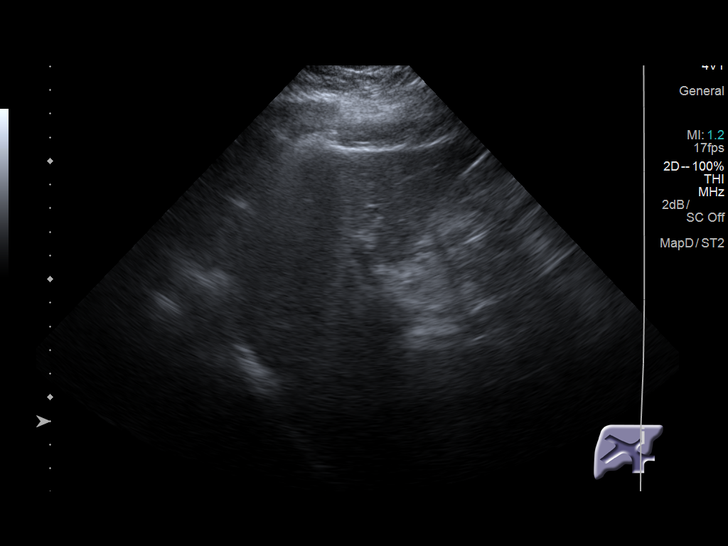
[im 23/49]
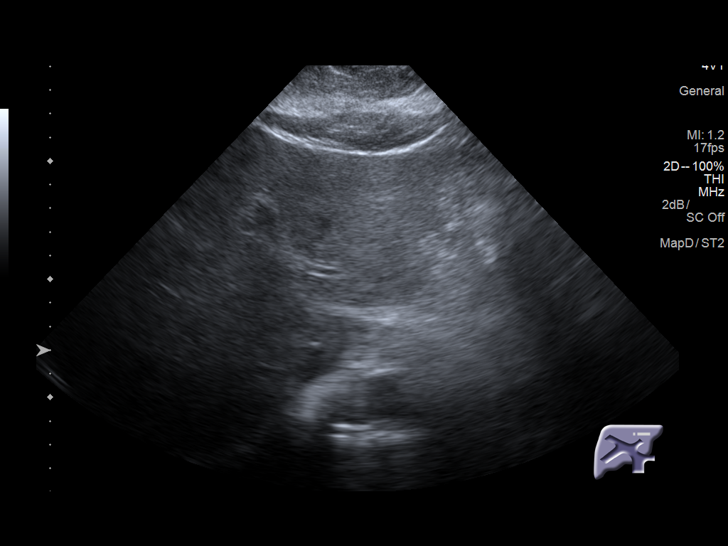
[im 27/49]
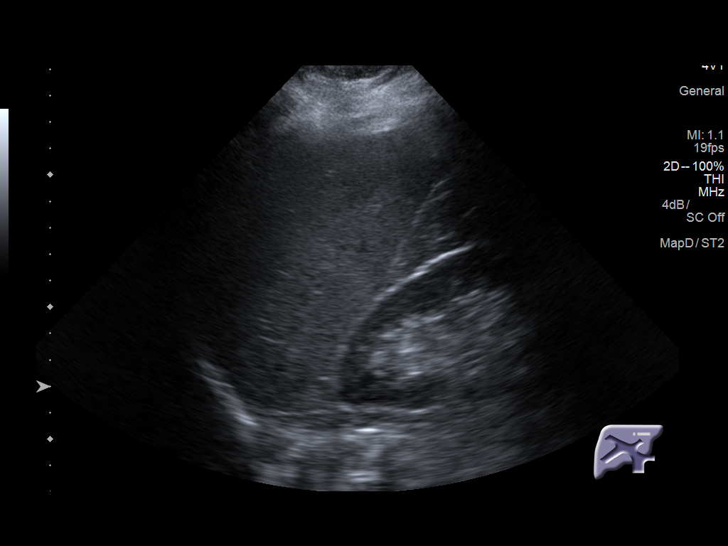
[im 31/49]
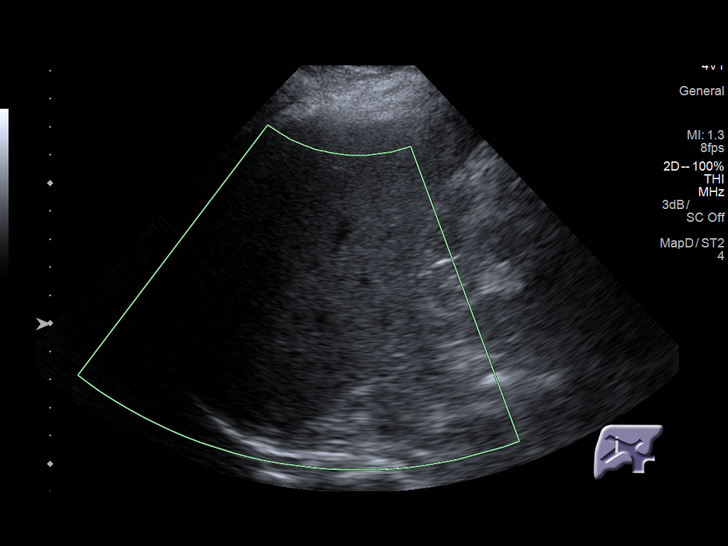
[im 33/49]
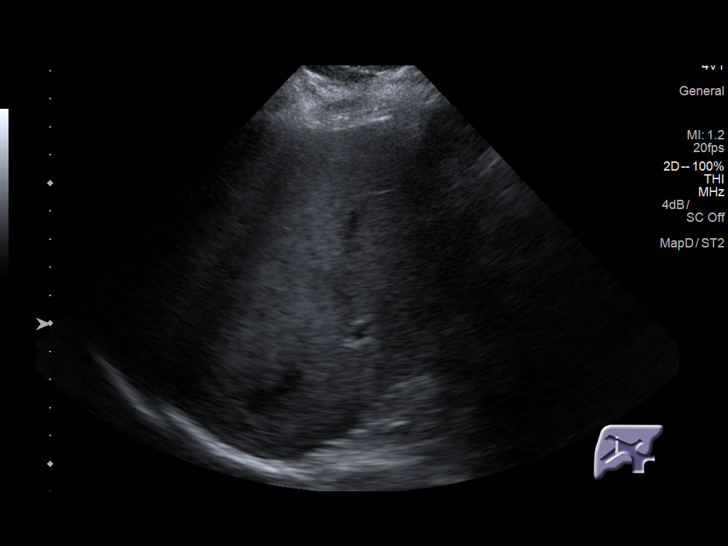
[im 37/49]
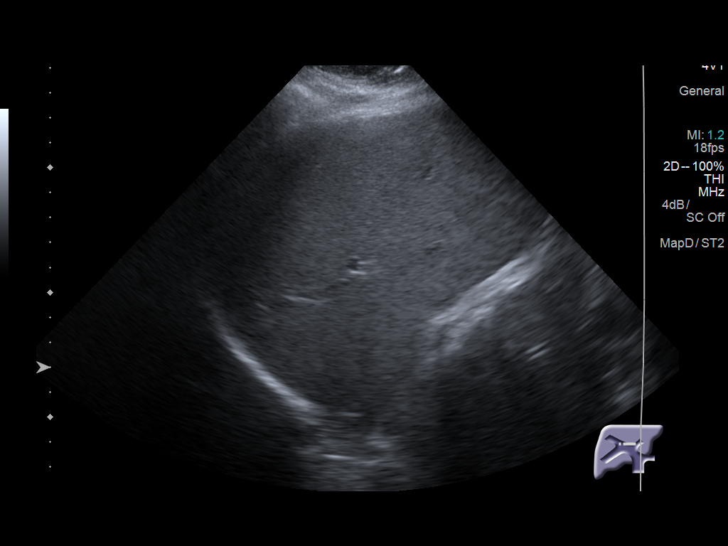
[im 41/49]
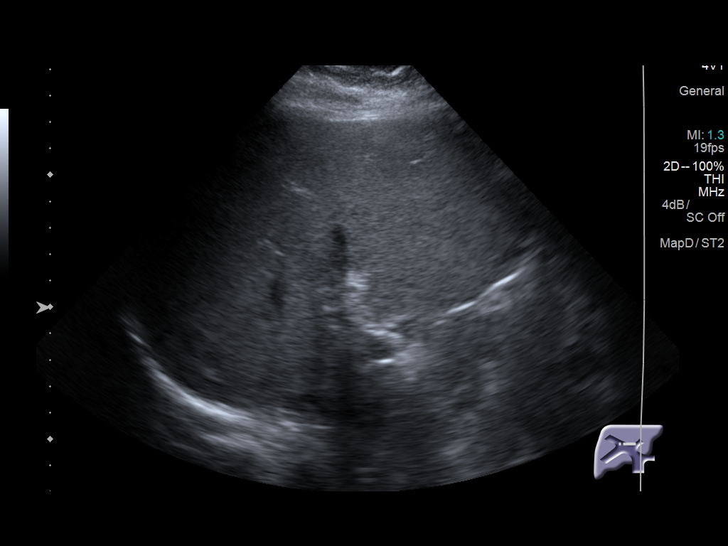
[im 45/49]
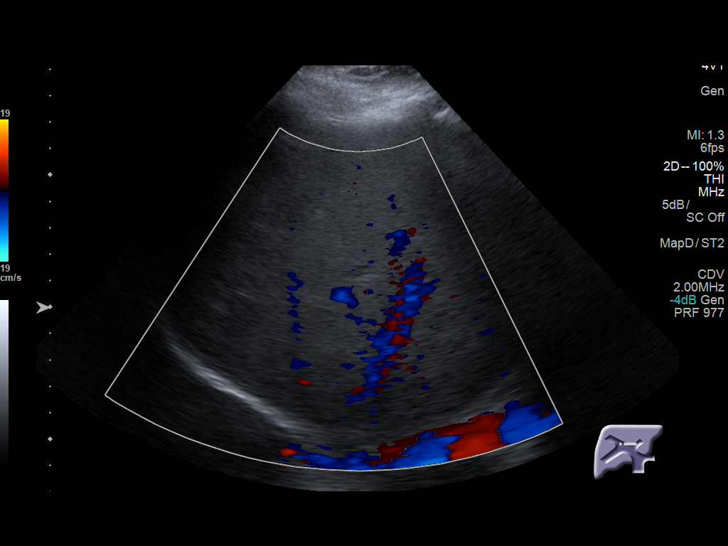
[im 49/49]
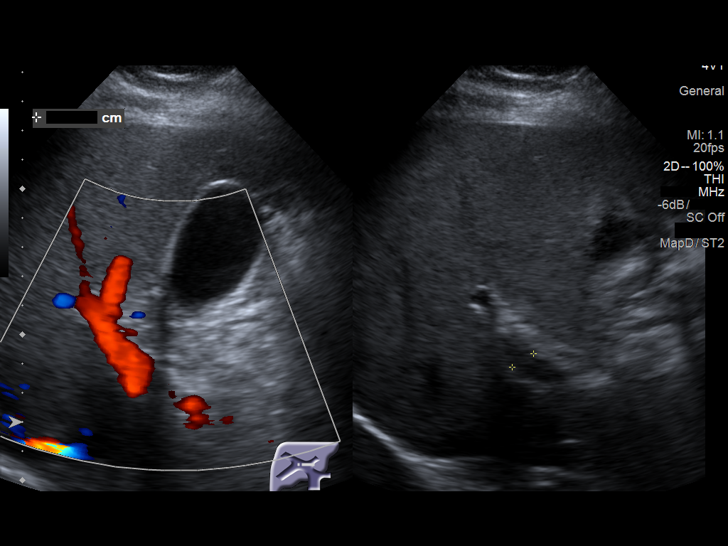

[14 of 25 positions shown; findings below may reference images not displayed]

FINDINGS: Gallbladder:

No gallstones or wall thickening visualized. There is no
pericholecystic fluid. No sonographic Murphy sign noted by
sonographer.

Common bile duct:

Diameter: 2 mm. No intrahepatic or extrahepatic biliary duct
dilatation.

Liver:

No focal lesion identified. Within normal limits in parenchymal
echogenicity. Portal vein is patent on color Doppler imaging with
normal direction of blood flow towards the liver.
IMPRESSION: Study within normal limits.

## 2020-06-26 DIAGNOSIS — Z3042 Encounter for surveillance of injectable contraceptive: Secondary | ICD-10-CM | POA: Diagnosis not present

## 2020-07-18 ENCOUNTER — Encounter: Payer: Self-pay | Admitting: Family Medicine

## 2020-07-19 ENCOUNTER — Other Ambulatory Visit: Payer: Self-pay

## 2020-07-19 ENCOUNTER — Encounter: Payer: Self-pay | Admitting: Nurse Practitioner

## 2020-07-19 ENCOUNTER — Ambulatory Visit: Payer: BC Managed Care – PPO | Admitting: Nurse Practitioner

## 2020-07-19 VITALS — BP 111/70 | HR 86 | Temp 97.9°F | Ht 64.0 in | Wt 278.8 lb

## 2020-07-19 DIAGNOSIS — L0291 Cutaneous abscess, unspecified: Secondary | ICD-10-CM | POA: Diagnosis not present

## 2020-07-19 MED ORDER — DOXYCYCLINE HYCLATE 100 MG PO TABS
100.0000 mg | ORAL_TABLET | Freq: Two times a day (BID) | ORAL | 0 refills | Status: DC
Start: 2020-07-19 — End: 2020-10-25

## 2020-07-19 NOTE — Progress Notes (Signed)
Acute Office Visit  Subjective:    Patient ID: Sherri Patel, female    DOB: [birth year], 45 y.o.   MRN: [id number]  Chief Complaint  Patient presents with  . Abscess    HPI Patient is in today for Abscess: Patient presents for evaluation of a cutaneous abscess. Lesion is located in the left inguinal region. Onset was 4 days ago. Symptoms have gradually worsened. Abscess has associated symptoms of pain. Patient does not have previous history of cutaneous abscesses. Patient does not have diabetes.  I & D  Date/Time: 07/19/2020 10:34 AM Performed by: Daryll Drown, NP Authorized by: Daryll Drown, NP   Consent:    Consent obtained:  Verbal Location:    Type:  Abscess Pre-procedure details:    Skin preparation:  Betadine Anesthesia (see MAR for exact dosages):    Anesthesia method:  Nerve block   Block needle gauge:  25 G   Block anesthetic:  Lidocaine 2% WITH epi   Block injection procedure:  Anatomic landmarks identified and anatomic landmarks palpated   Block outcome:  Anesthesia achieved Procedure type:    Complexity:  Simple Procedure details:    Needle aspiration: no     Incision depth:  Dermal   Scalpel blade:  11   Wound management:  Extensive cleaning   Drainage:  Purulent and serosanguinous   Drainage amount:  Moderate   Wound treatment:  Wound left open   Packing materials:  None Post-procedure details:    Patient tolerance of procedure:  Tolerated well, no immediate complications       Past Medical History:  Diagnosis Date  . Allergy   . Dyslipidemia   . Epilepsy ([facility])   . Hyperlipidemia   . Hypothyroidism   . Overweight(278.02)   . Seizures ([facility])   . SOB (shortness of breath)   . Stress     Past Surgical History:  Procedure Laterality Date  . CHOLECYSTECTOMY N/A 01/19/2018   Procedure: LAPAROSCOPIC CHOLECYSTECTOMY;  Surgeon: Lucretia Roers, MD;  Location: [facility];  Service: General;  Laterality: N/A;  . KNEE SURGERY Right    . Resection of Right dorsal ganglion Right    wrist  . WRIST SURGERY Right     Family History  Problem Relation Age of Onset  . COPD Mother   . Cancer Mother   . Diabetes Mother   . Depression Mother   . Bipolar disorder Mother   . COPD Father   . Cancer Father        prostate  . Hypertension Father     Social History   Socioeconomic History  . Marital status: Married    Spouse name: Not on file  . Number of children: 3  . Years of education: Not on file  . Highest education level: Not on file  Occupational History    Employer: UNEMPLOYED  Tobacco Use  . Smoking status: Never Smoker  . Smokeless tobacco: Never Used  Vaping Use  . Vaping Use: Never used  Substance and Sexual Activity  . Alcohol use: No  . Drug use: No  . Sexual activity: Yes    Birth control/protection: Injection  Other Topics Concern  . Not on file  Social History Narrative  . Not on file   Social Determinants of Health   Financial Resource Strain: Not on file  Food Insecurity: Not on file  Transportation Needs: Not on file  Physical Activity: Not on file  Stress: Not on file  Social Connections: Not on file  Intimate Partner Violence: Not on file    Outpatient Medications Prior to Visit  Medication Sig Dispense Refill  . acetaminophen (TYLENOL) 500 MG tablet Take 1,000 mg by mouth daily as needed for moderate pain or headache.    . cefdinir (OMNICEF) 300 MG capsule Take 1 capsule (300 mg total) by mouth 2 (two) times daily. 1 po BID 20 capsule 0  . EUTHYROX 150 MCG tablet Take 1 tablet by mouth once daily 90 tablet 2  . ibuprofen (ADVIL,MOTRIN) 200 MG tablet Take 400 mg by mouth daily as needed for headache or moderate pain.    Marland Kitchen lamoTRIgine (LAMICTAL) 150 MG tablet Take 150 mg by mouth 2 (two) times daily.     Marland Kitchen levETIRAcetam (KEPPRA) 750 MG tablet Take 750 mg by mouth 2 (two) times daily.    . medroxyPROGESTERone (DEPO-PROVERA) 150 MG/ML injection Inject 150 mg into the muscle every  3 (three) months.    . zolmitriptan (ZOMIG) 5 MG tablet Take by mouth.     No facility-administered medications prior to visit.    Allergies  Allergen Reactions  . Penicillins     Upset stomach Has patient had a PCN reaction causing immediate rash, facial/tongue/throat swelling, SOB or lightheadedness with hypotension: No Has patient had a PCN reaction causing severe rash involving mucus membranes or skin necrosis: No Has patient had a PCN reaction that required hospitalization: No Has patient had a PCN reaction occurring within the last 10 years: Yes If all of the above answers are "NO", then may proceed with Cephalosporin use.     Review of Systems  Constitutional: Negative.   HENT: Negative.   Eyes: Negative.   Respiratory: Negative.   Cardiovascular: Negative.   Gastrointestinal: Negative.   Genitourinary: Negative.   Skin: Positive for color change, rash and wound.  All other systems reviewed and are negative.      Objective:    Physical Exam Vitals reviewed.  Constitutional:      Appearance: Normal appearance.  HENT:     Head: Normocephalic.     Nose: Nose normal.  Eyes:     Conjunctiva/sclera: Conjunctivae normal.  Cardiovascular:     Pulses: Normal pulses.     Heart sounds: Normal heart sounds.  Pulmonary:     Effort: Pulmonary effort is normal.     Breath sounds: Normal breath sounds.  Abdominal:     General: Bowel sounds are normal.  Musculoskeletal:        General: Tenderness present.  Skin:    Findings: Erythema and rash present.  Neurological:     Mental Status: She is alert.  Psychiatric:        Mood and Affect: Mood normal.     BP 111/70   Pulse 86   Temp 97.9 F (36.6 C)   Ht 5\' 4"  (1.626 m)   Wt 278 lb 12.8 oz (126.5 kg)   SpO2 98%   BMI 47.86 kg/m  Wt Readings from Last 3 Encounters:  07/19/20 278 lb 12.8 oz (126.5 kg)  09/05/19 277 lb (125.6 kg)  08/05/18 261 lb (118.4 kg)    Health Maintenance Due  Topic Date Due  .  Hepatitis C Screening  Never done  . COVID-19 Vaccine (1) Never done  . HIV Screening  Never done  . PAP SMEAR-Modifier  02/27/2018    There are no preventive care reminders to display for this patient.   Lab Results  Component Value Date  TSH 1.570 11/10/2019   Lab Results  Component Value Date   WBC 8.7 09/05/2019   HGB 14.0 09/05/2019   HCT 42.1 09/05/2019   MCV 92 09/05/2019   PLT 319 09/05/2019   Lab Results  Component Value Date   NA 138 09/05/2019   K 4.1 09/05/2019   CO2 19 (L) 09/05/2019   GLUCOSE 106 (H) 09/05/2019   BUN 9 09/05/2019   CREATININE 0.80 09/05/2019   BILITOT 0.4 09/05/2019   ALKPHOS 83 09/05/2019   AST 14 09/05/2019   ALT 19 09/05/2019   PROT 6.5 09/05/2019   ALBUMIN 4.1 09/05/2019   CALCIUM 8.9 09/05/2019   ANIONGAP 6 01/17/2018   Lab Results  Component Value Date   CHOL 165 09/05/2019   Lab Results  Component Value Date   HDL 56 09/05/2019   Lab Results  Component Value Date   LDLCALC 94 09/05/2019   Lab Results  Component Value Date   TRIG 81 09/05/2019   Lab Results  Component Value Date   CHOLHDL 2.9 09/05/2019        Assessment & Plan:   Problem List Items Addressed This Visit      Other   Abscess - Primary    Abscess on right inner thigh  Worse in the last 4 days.  I&D completed patient tolerated procedure well, started on doxycycline 100 mg for 7 days. Follow up in 2 weeks. Education provided with printed hand out given. RX sent to pharmacy           Relevant Medications   doxycycline (VIBRA-TABS) 100 MG tablet   Other Relevant Orders   I & D       Meds ordered this encounter  Medications  . doxycycline (VIBRA-TABS) 100 MG tablet    Sig: Take 1 tablet (100 mg total) by mouth 2 (two) times daily.    Dispense:  14 tablet    Refill:  0    Order Specific Question:   Supervising Provider    Answer:   Raliegh Ip [[id number]]     Daryll Drown, NP

## 2020-07-19 NOTE — Assessment & Plan Note (Signed)
We reviewed the etiology of recurrent abscesses of skin.  Skin abscesses are collections of pus within the dermis and deeper skin tissues. Skin abscesses manifest as painful, tender, fluctuant, and erythematous nodules, frequently surmounted by a pustule and surrounded by a rim of erythematous swelling.  Spontaneous drainage of purulent material may occur.  Fever can occur on occasion.   -Skin abscesses can develop in healthy individuals with no predisposing conditions other than skin or nasal carriage of Staphylococcus aureus.  Individuals in close contact with others who have active infection with skin abscesses are at increased risk which is likely to explain why twin brother has similar episodes.   In addition, any process leading to a breach in the skin barrier can also predispose to the development of a skin abscesses, such as atopic dermatitis.

## 2020-07-19 NOTE — Patient Instructions (Signed)
Skin Abscess  A skin abscess is an infected area on or under your skin that contains a collection of pus and other material. An abscess may also be called a furuncle, carbuncle, or boil. An abscess can occur in or on almost any part of your body. Some abscesses break open (rupture) on their own. Most continue to get worse unless they are treated. The infection can spread deeper into the body and eventually into your blood, which can make you feel ill. Treatment usually involves draining the abscess. What are the causes? An abscess occurs when germs, like bacteria, pass through your skin and cause an infection. This may be caused by:  A scrape or cut on your skin.  A puncture wound through your skin, including a needle injection or insect bite.  Blocked oil or sweat glands.  Blocked and infected hair follicles.  A cyst that forms beneath your skin (sebaceous cyst) and becomes infected. What increases the risk? This condition is more likely to develop in people who:  Have a weak body defense system (immune system).  Have diabetes.  Have dry and irritated skin.  Get frequent injections or use illegal IV drugs.  Have a foreign body in a wound, such as a splinter.  Have problems with their lymph system or veins. What are the signs or symptoms? Symptoms of this condition include:  A painful, firm bump under the skin.  A bump with pus at the top. This may break through the skin and drain. Other symptoms include:  Redness surrounding the abscess site.  Warmth.  Swelling of the lymph nodes (glands) near the abscess.  Tenderness.  A sore on the skin. How is this diagnosed? This condition may be diagnosed based on:  A physical exam.  Your medical history.  A sample of pus. This may be used to find out what is causing the infection.  Blood tests.  Imaging tests, such as an ultrasound, CT scan, or MRI. How is this treated? A small abscess that drains on its own may not  need treatment. Treatment for larger abscesses may include:  Moist heat or heat pack applied to the area several times a day.  A procedure to drain the abscess (incision and drainage).  Antibiotic medicines. For a severe abscess, you may first get antibiotics through an IV and then change to antibiotics by mouth. Follow these instructions at home: Medicines  Take over-the-counter and prescription medicines only as told by your health care provider.  If you were prescribed an antibiotic medicine, take it as told by your health care provider. Do not stop taking the antibiotic even if you start to feel better.   Abscess care  If you have an abscess that has not drained, apply heat to the affected area. Use the heat source that your health care provider recommends, such as a moist heat pack or a heating pad. ? Place a towel between your skin and the heat source. ? Leave the heat on for 20-30 minutes. ? Remove the heat if your skin turns bright red. This is especially important if you are unable to feel pain, heat, or cold. You may have a greater risk of getting burned.  Follow instructions from your health care provider about how to take care of your abscess. Make sure you: ? Cover the abscess with a bandage (dressing). ? Change your dressing or gauze as told by your health care provider. ? Wash your hands with soap and water before you change the   dressing or gauze. If soap and water are not available, use hand sanitizer.  Check your abscess every day for signs of a worsening infection. Check for: ? More redness, swelling, or pain. ? More fluid or blood. ? Warmth. ? More pus or a bad smell.   General instructions  To avoid spreading the infection: ? Do not share personal care items, towels, or hot tubs with others. ? Avoid making skin contact with other people.  Keep all follow-up visits as told by your health care provider. This is important. Contact a health care provider if you  have:  More redness, swelling, or pain around your abscess.  More fluid or blood coming from your abscess.  Warm skin around your abscess.  More pus or a bad smell coming from your abscess.  A fever.  Muscle aches.  Chills or a general ill feeling. Get help right away if you:  Have severe pain.  See red streaks on your skin spreading away from the abscess. Summary  A skin abscess is an infected area on or under your skin that contains a collection of pus and other material.  A small abscess that drains on its own may not need treatment.  Treatment for larger abscesses may include having a procedure to drain the abscess and taking an antibiotic. This information is not intended to replace advice given to you by your health care provider. Make sure you discuss any questions you have with your health care provider. Document Revised: 09/15/2018 Document Reviewed: 07/08/2017 Elsevier Patient Education  2021 [employer] Inc[employer]  

## 2020-07-19 NOTE — Assessment & Plan Note (Signed)
Abscess on right inner thigh  Worse in the last 4 days.  I&D completed patient tolerated procedure well, started on doxycycline 100 mg for 7 days. Follow up in 2 weeks. Education provided with printed hand out given. RX sent to pharmacy

## 2020-09-11 DIAGNOSIS — Z3042 Encounter for surveillance of injectable contraceptive: Secondary | ICD-10-CM | POA: Diagnosis not present

## 2020-09-30 ENCOUNTER — Other Ambulatory Visit: Payer: Self-pay | Admitting: *Deleted

## 2020-09-30 MED ORDER — LEVOTHYROXINE SODIUM 150 MCG PO TABS: 150.0000 ug | ORAL_TABLET | Freq: Every day | ORAL | 0 refills | Status: DC

## 2020-10-25 ENCOUNTER — Encounter: Payer: Self-pay | Admitting: Family Medicine

## 2020-10-25 ENCOUNTER — Ambulatory Visit (INDEPENDENT_AMBULATORY_CARE_PROVIDER_SITE_OTHER): Payer: BC Managed Care – PPO | Admitting: Family Medicine

## 2020-10-25 ENCOUNTER — Other Ambulatory Visit: Payer: Self-pay

## 2020-10-25 VITALS — BP 125/82 | HR 79 | Temp 97.7°F | Ht 64.0 in | Wt 272.2 lb

## 2020-10-25 DIAGNOSIS — Z0000 Encounter for general adult medical examination without abnormal findings: Secondary | ICD-10-CM

## 2020-10-25 DIAGNOSIS — E559 Vitamin D deficiency, unspecified: Secondary | ICD-10-CM | POA: Diagnosis not present

## 2020-10-25 DIAGNOSIS — Z8249 Family history of ischemic heart disease and other diseases of the circulatory system: Secondary | ICD-10-CM | POA: Diagnosis not present

## 2020-10-25 DIAGNOSIS — R002 Palpitations: Secondary | ICD-10-CM | POA: Diagnosis not present

## 2020-10-25 DIAGNOSIS — E034 Atrophy of thyroid (acquired): Secondary | ICD-10-CM | POA: Diagnosis not present

## 2020-10-25 DIAGNOSIS — J301 Allergic rhinitis due to pollen: Secondary | ICD-10-CM

## 2020-10-25 DIAGNOSIS — Z0001 Encounter for general adult medical examination with abnormal findings: Secondary | ICD-10-CM | POA: Diagnosis not present

## 2020-10-25 DIAGNOSIS — E538 Deficiency of other specified B group vitamins: Secondary | ICD-10-CM | POA: Diagnosis not present

## 2020-10-25 DIAGNOSIS — B001 Herpesviral vesicular dermatitis: Secondary | ICD-10-CM

## 2020-10-25 DIAGNOSIS — F418 Other specified anxiety disorders: Secondary | ICD-10-CM

## 2020-10-25 DIAGNOSIS — G40109 Localization-related (focal) (partial) symptomatic epilepsy and epileptic syndromes with simple partial seizures, not intractable, without status epilepticus: Secondary | ICD-10-CM

## 2020-10-25 DIAGNOSIS — R9431 Abnormal electrocardiogram [ECG] [EKG]: Secondary | ICD-10-CM

## 2020-10-25 LAB — BAYER DCA HB A1C WAIVED: HB A1C (BAYER DCA - WAIVED): 5.6 % (ref <7.0)

## 2020-10-25 MED ORDER — VALACYCLOVIR HCL 1 G PO TABS: 2000.0000 mg | ORAL_TABLET | Freq: Two times a day (BID) | ORAL | 0 refills | Status: DC

## 2020-10-25 MED ORDER — LEVOCETIRIZINE DIHYDROCHLORIDE 5 MG PO TABS: 5.0000 mg | ORAL_TABLET | Freq: Every evening | ORAL | 3 refills | Status: DC

## 2020-10-25 MED ORDER — HYDROXYZINE HCL 50 MG PO TABS: 25.0000 mg | ORAL_TABLET | Freq: Three times a day (TID) | ORAL | 0 refills | Status: DC | PRN

## 2020-10-25 MED ORDER — MOMETASONE FUROATE 50 MCG/ACT NA SUSP: 2.0000 | Freq: Every day | NASAL | 2 refills | Status: DC

## 2020-10-25 NOTE — Patient Instructions (Signed)
You had labs performed today.  You will be contacted with the results of the labs once they are available, usually in the next 3 business days for routine lab work.  If you have an active my chart account, they will be released to your MyChart.  If you prefer to have these labs released to you via telephone, please let us know.  If you had a pap smear or biopsy performed, expect to be contacted in about 7-10 days.  New age for colon cancer screening is 51.  Check with insurance to see if they will cover Colonoscopy/ Cologuard for you   Preventive Care 31-12 Years Old, Female Preventive care refers to lifestyle choices and visits with your health care provider that can promote health and wellness. This includes:  A yearly physical exam. This is also called an annual wellness visit.  Regular dental and eye exams.  Immunizations.  Screening for certain conditions.  Healthy lifestyle choices, such as: ? Eating a healthy diet. ? Getting regular exercise. ? Not using drugs or products that contain nicotine and tobacco. ? Limiting alcohol use. What can I expect for my preventive care visit? Physical exam Your health care provider will check your:  Height and weight. These may be used to calculate your BMI (body mass index). BMI is a measurement that tells if you are at a healthy weight.  Heart rate and blood pressure.  Body temperature.  Skin for abnormal spots. Counseling Your health care provider may ask you questions about your:  Past medical problems.  Family's medical history.  Alcohol, tobacco, and drug use.  Emotional well-being.  Home life and relationship well-being.  Sexual activity.  Diet, exercise, and sleep habits.  Work and work Statistician.  Access to firearms.  Method of birth control.  Menstrual cycle.  Pregnancy history. What immunizations do I need? Vaccines are usually given at various ages, according to a schedule. Your health care provider  will recommend vaccines for you based on your age, medical history, and lifestyle or other factors, such as travel or where you work.   What tests do I need? Blood tests  Lipid and cholesterol levels. These may be checked every 5 years, or more often if you are over 92 years old.  Hepatitis C test.  Hepatitis B test. Screening  Lung cancer screening. You may have this screening every year starting at age 23 if you have a 30-pack-year history of smoking and currently smoke or have quit within the past 15 years.  Colorectal cancer screening. ? All adults should have this screening starting at age 2 and continuing until age 77. ? Your health care provider may recommend screening at age 39 if you are at increased risk. ? You will have tests every 1-10 years, depending on your results and the type of screening test.  Diabetes screening. ? This is done by checking your blood sugar (glucose) after you have not eaten for a while (fasting). ? You may have this done every 1-3 years.  Mammogram. ? This may be done every 1-2 years. ? Talk with your health care provider about when you should start having regular mammograms. This may depend on whether you have a family history of breast cancer.  BRCA-related cancer screening. This may be done if you have a family history of breast, ovarian, tubal, or peritoneal cancers.  Pelvic exam and Pap test. ? This may be done every 3 years starting at age 49. ? Starting at age 57, this  may be done every 5 years if you have a Pap test in combination with an HPV test. Other tests  STD (sexually transmitted disease) testing, if you are at risk.  Bone density scan. This is done to screen for osteoporosis. You may have this scan if you are at high risk for osteoporosis. Talk with your health care provider about your test results, treatment options, and if necessary, the need for more tests. Follow these instructions at home: Eating and drinking  Eat a  diet that includes fresh fruits and vegetables, whole grains, lean protein, and low-fat dairy products.  Take vitamin and mineral supplements as recommended by your health care provider.  Do not drink alcohol if: ? Your health care provider tells you not to drink. ? You are pregnant, may be pregnant, or are planning to become pregnant.  If you drink alcohol: ? Limit how much you have to 0-1 drink a day. ? Be aware of how much alcohol is in your drink. In the [place] one drink equals one 12 oz bottle of beer (355 mL), one 5 oz glass of wine (148 mL), or one 1 oz glass of hard liquor (44 mL).   Lifestyle  Take daily care of your teeth and gums. Brush your teeth every morning and night with fluoride toothpaste. Floss one time each day.  Stay active. Exercise for at least 30 minutes 5 or more days each week.  Do not use any products that contain nicotine or tobacco, such as cigarettes, e-cigarettes, and chewing tobacco. If you need help quitting, ask your health care provider.  Do not use drugs.  If you are sexually active, practice safe sex. Use a condom or other form of protection to prevent STIs (sexually transmitted infections).  If you do not wish to become pregnant, use a form of birth control. If you plan to become pregnant, see your health care provider for a prepregnancy visit.  If told by your health care provider, take low-dose aspirin daily starting at age 63.  Find healthy ways to cope with stress, such as: ? Meditation, yoga, or listening to music. ? Journaling. ? Talking to a trusted person. ? Spending time with friends and family. Safety  Always wear your seat belt while driving or riding in a vehicle.  Do not drive: ? If you have been drinking alcohol. Do not ride with someone who has been drinking. ? When you are tired or distracted. ? While texting.  Wear a helmet and other protective equipment during sports activities.  If you have firearms in your house,  make sure you follow all gun safety procedures. What's next?  Visit your health care provider once a year for an annual wellness visit.  Ask your health care provider how often you should have your eyes and teeth checked.  Stay up to date on all vaccines. This information is not intended to replace advice given to you by your health care provider. Make sure you discuss any questions you have with your health care provider. Document Revised: 02/27/2020 Document Reviewed: 02/03/2018 Elsevier Patient Education  2021 [employer].

## 2020-10-25 NOTE — Progress Notes (Signed)
Sherri Patel is a 45 y.o. female presents to office today for annual physical exam examination.    Concerns today include: 1.  Allergic rhinitis Patient reports that she has been having nasal drainage, sinus pressure.  This has been refractory to OTC sinus medication.  She thinks this is her allergies but is worried about taking medications over-the-counter because she has had exacerbations of her seizure disorder on certain over-the-counter medicines.  Not currently taking anything that is strictly an antihistamine nor she using any nasal sprays.  No fevers.  2.  Heart palpitations Patient reports having intermittent heart palpitations.  Symptoms seem to be a little bit more frequent lately.  She had this evaluated several years ago and there was nothing concerning.  She does not report any overt chest pain, shortness of breath.  She is compliant with her Synthroid.  3.  Seizure disorder Patient is compliant with Lamictal and Keppra.  Last seizure was about 3 weeks ago and she describes this as a short lived tonic-clonic seizure.  She sees Dr. Bjorn Loser in [place] at [facility] and they asked that we get Keppra levels along with today's labs  4.  Fever blisters Patient reports that she has intermittent fever blisters that she calls cluster lesions.  She has at least 5 flares per year and wants to know if there is a medication she can take orally to treat these.  Last colonoscopy: never Last mammogram: UTD Last pap smear: declines Refills needed today: none Immunizations needed:  Immunization History  Administered Date(s) Administered  . Tdap 04/03/2013     Past Medical History:  Diagnosis Date  . Allergy   . Dyslipidemia   . Epilepsy ([place])   . Hyperlipidemia   . Hypothyroidism   . Overweight(278.02)   . Seizures ([place])   . SOB (shortness of breath)   . Stress    Social History   Socioeconomic History  . Marital status: Married    Spouse name: Not on file  .  Number of children: 3  . Years of education: Not on file  . Highest education level: Not on file  Occupational History    Employer: UNEMPLOYED  Tobacco Use  . Smoking status: Never Smoker  . Smokeless tobacco: Never Used  Vaping Use  . Vaping Use: Never used  Substance and Sexual Activity  . Alcohol use: No  . Drug use: No  . Sexual activity: Yes    Birth control/protection: Injection  Other Topics Concern  . Not on file  Social History Narrative  . Not on file   Social Determinants of Health   Financial Resource Strain: Not on file  Food Insecurity: Not on file  Transportation Needs: Not on file  Physical Activity: Not on file  Stress: Not on file  Social Connections: Not on file  Intimate Partner Violence: Not on file   Past Surgical History:  Procedure Laterality Date  . CHOLECYSTECTOMY N/A 01/19/2018   Procedure: LAPAROSCOPIC CHOLECYSTECTOMY;  Surgeon: Virl Cagey, MD;  Location: [facility];  Service: General;  Laterality: N/A;  . KNEE SURGERY Right   . Resection of Right dorsal ganglion Right    wrist  . WRIST SURGERY Right    Family History  Problem Relation Age of Onset  . COPD Mother   . Cancer Mother   . Diabetes Mother   . Depression Mother   . Bipolar disorder Mother   . COPD Father   . Cancer Father  prostate  . Hypertension Father     Current Outpatient Medications:  .  acetaminophen (TYLENOL) 500 MG tablet, Take 1,000 mg by mouth daily as needed for moderate pain or headache., Disp: , Rfl:  .  cefdinir (OMNICEF) 300 MG capsule, Take 1 capsule (300 mg total) by mouth 2 (two) times daily. 1 po BID, Disp: 20 capsule, Rfl: 0 .  doxycycline (VIBRA-TABS) 100 MG tablet, Take 1 tablet (100 mg total) by mouth 2 (two) times daily., Disp: 14 tablet, Rfl: 0 .  ibuprofen (ADVIL,MOTRIN) 200 MG tablet, Take 400 mg by mouth daily as needed for headache or moderate pain., Disp: , Rfl:  .  lamoTRIgine (LAMICTAL) 150 MG tablet, Take 150 mg by mouth 2 (two)  times daily. , Disp: , Rfl:  .  levETIRAcetam (KEPPRA) 750 MG tablet, Take 750 mg by mouth 2 (two) times daily., Disp: , Rfl:  .  levothyroxine (EUTHYROX) 150 MCG tablet, Take 1 tablet (150 mcg total) by mouth daily., Disp: 90 tablet, Rfl: 0 .  medroxyPROGESTERone (DEPO-PROVERA) 150 MG/ML injection, Inject 150 mg into the muscle every 3 (three) months., Disp: , Rfl:  .  zolmitriptan (ZOMIG) 5 MG tablet, Take by mouth., Disp: , Rfl:   Allergies  Allergen Reactions  . Penicillins     Upset stomach Has patient had a PCN reaction causing immediate rash, facial/tongue/throat swelling, SOB or lightheadedness with hypotension: No Has patient had a PCN reaction causing severe rash involving mucus membranes or skin necrosis: No Has patient had a PCN reaction that required hospitalization: No Has patient had a PCN reaction occurring within the last 10 years: Yes If all of the above answers are "NO", then may proceed with Cephalosporin use.      ROS: Review of Systems Pertinent items noted in HPI and remainder of comprehensive ROS otherwise negative.    Physical exam BP 125/82   Pulse 79   Temp 97.7 F (36.5 C)   Ht _0  (1.626 m)   Wt 272 lb 3.2 oz (123.5 kg)   SpO2 98%   BMI 46.72 kg/m  General appearance: alert, cooperative, appears stated age and no distress Head: Normocephalic, without obvious abnormality, atraumatic Eyes: negative findings: lids and lashes normal, conjunctivae and sclerae normal, corneas clear and pupils equal, round, reactive to light and accomodation Ears: normal TM's and external ear canals both ears Nose: Nares normal. Septum midline. Mucosa normal. No drainage or sinus tenderness. Throat: lips, mucosa, and tongue normal; teeth and gums normal Neck: no adenopathy, supple, symmetrical, trachea midline and thyroid not enlarged, symmetric, no tenderness/mass/nodules Back: symmetric, no curvature. ROM normal. No CVA tenderness. Lungs: clear to auscultation  bilaterally Heart: regular rate and rhythm, S1, S2 normal, no murmur, click, rub or gallop Abdomen: soft, non-tender; bowel sounds normal; no masses,  no organomegaly Extremities: extremities normal, atraumatic, no cyanosis or edema Pulses: 2+ and symmetric Skin: She has a few lesions along the trunk and upper extremity that look to be like excoriated macular papular lesions.  There is no exudate.  There is no heat or fluctuance Lymph nodes: Cervical, supraclavicular, and axillary nodes normal. Neurologic: Alert and oriented X 3, normal strength and tone. Normal symmetric reflexes. Normal coordination and gait Psych: Somewhat anxious.  Patient is pleasant and interactive.  Does not appear to be responding to internal stimuli      Assessment/ Plan: Barney Drain here for annual physical exam.   Annual physical exam  Hypothyroidism due to acquired atrophy of thyroid -  Plan: TSH, T4, Free  B12 deficiency - Plan: CBC, Vitamin B12  Vitamin D deficiency - Plan: VITAMIN D 25 Hydroxy (Vit-D Deficiency, Fractures)  Partial symptomatic epilepsy with simple partial seizures, not intractable, without status epilepticus (HCC) - Plan: CMP14+EGFR, Levetiracetam level  Morbid obesity ([place]) - Plan: Bayer DCA Hb A1c Waived, Lipid Panel, CMP14+EGFR  Herpes labialis without complication - Plan: valACYclovir (VALTREX) 1000 MG tablet  Seasonal allergic rhinitis due to pollen - Plan: hydrOXYzine (ATARAX/VISTARIL) 50 MG tablet, mometasone (NASONEX) 50 MCG/ACT nasal spray, levocetirizine (XYZAL) 5 MG tablet  Situational anxiety - Plan: hydrOXYzine (ATARAX/VISTARIL) 50 MG tablet  Abnormal EKG - Plan: Ambulatory referral to Cardiology  Family history of heart attack - Plan: Ambulatory referral to Cardiology  Check labs.  She is having some heart palpitations.  Unsure if this is to do with her thyroid disease  Check B12 level  Check complete metabolic panel, Keppra level.  We will plan to CC these  results to Dr. Hyman Bower with [facility] in [place] A1c, fasting lipid panel given obesity  Valtrex as needed HSV flare  Nasonex, Xyzal as needed allergies.  Hydroxyzine may be used as needed as well but this was really prescribed for situational anxiety.  Caution sedation  EKG was obtained given her reports of heart palpitations.  I compared this to her January 2012 EKG and there is some change compared to that 2012 EKG.  She is got some T wave depression/inversion in V2, V3 and V4.  No arrhythmia was appreciated however.  Referral to cardiology has been placed as a result, particularly given family history of heart attack    Counseled on healthy lifestyle choices, including diet (rich in fruits, vegetables and lean meats and low in salt and simple carbohydrates) and exercise (at least 30 minutes of moderate physical activity daily).  Patient to follow up in 1 year for annual exam or sooner if needed.  Karsyn Jamie M. Lajuana Ripple, DO

## 2020-10-26 LAB — CBC
RBC: 4.83 x10E6/uL (ref 3.77–5.28)
WBC: 8.1 10*3/uL (ref 3.4–10.8)

## 2020-10-26 LAB — CMP14+EGFR
ALT: 17 IU/L (ref 0–32)
BUN/Creatinine Ratio: 10 (ref 9–23)
Calcium: 9.2 mg/dL (ref 8.7–10.2)
Chloride: 104 mmol/L (ref 96–106)
Creatinine, Ser: 0.7 mg/dL (ref 0.57–1.00)
Globulin, Total: 2.5 g/dL (ref 1.5–4.5)
Glucose: 97 mg/dL (ref 65–99)
Total Protein: 6.8 g/dL (ref 6.0–8.5)

## 2020-10-26 LAB — T4, FREE: Free T4: 1.33 ng/dL (ref 0.82–1.77)

## 2020-10-28 LAB — CMP14+EGFR
AST: 14 IU/L (ref 0–40)
Albumin/Globulin Ratio: 1.7 (ref 1.2–2.2)
Albumin: 4.3 g/dL (ref 3.8–4.8)
Alkaline Phosphatase: 93 IU/L (ref 44–121)
BUN: 7 mg/dL (ref 6–24)
Bilirubin Total: 0.4 mg/dL (ref 0.0–1.2)
CO2: 20 mmol/L (ref 20–29)
Potassium: 4.2 mmol/L (ref 3.5–5.2)
Sodium: 140 mmol/L (ref 134–144)
eGFR: 109 mL/min/{1.73_m2} (ref >59)

## 2020-10-28 LAB — VITAMIN D 25 HYDROXY (VIT D DEFICIENCY, FRACTURES): Vit D, 25-Hydroxy: 34.7 ng/mL (ref 30.0–100.0)

## 2020-10-28 LAB — LIPID PANEL
Chol/HDL Ratio: 3.4 ratio (ref 0.0–4.4)
Cholesterol, Total: 186 mg/dL (ref 100–199)
HDL: 55 mg/dL (ref >39)
LDL Chol Calc (NIH): 116 mg/dL — ABNORMAL HIGH (ref 0–99)
Triglycerides: 82 mg/dL (ref 0–149)
VLDL Cholesterol Cal: 15 mg/dL (ref 5–40)

## 2020-10-28 LAB — TSH: TSH: 2.05 u[IU]/mL (ref 0.450–4.500)

## 2020-10-28 LAB — CBC
Hematocrit: 40.5 % (ref 34.0–46.6)
Hemoglobin: 13.5 g/dL (ref 11.1–15.9)
MCH: 28 pg (ref 26.6–33.0)
MCHC: 33.3 g/dL (ref 31.5–35.7)
MCV: 84 fL (ref 79–97)
Platelets: 368 10*3/uL (ref 150–450)
RDW: 12.4 % (ref 11.7–15.4)

## 2020-10-28 LAB — LEVETIRACETAM LEVEL: Levetiracetam Lvl: 21.1 ug/mL (ref 10.0–40.0)

## 2020-10-28 LAB — VITAMIN B12: Vitamin B-12: 540 pg/mL (ref 232–1245)

## 2020-11-27 DIAGNOSIS — Z3042 Encounter for surveillance of injectable contraceptive: Secondary | ICD-10-CM | POA: Diagnosis not present

## 2020-11-27 DIAGNOSIS — Z3009 Encounter for other general counseling and advice on contraception: Secondary | ICD-10-CM | POA: Diagnosis not present

## 2020-12-23 ENCOUNTER — Encounter: Payer: BC Managed Care – PPO | Admitting: Family Medicine

## 2020-12-25 ENCOUNTER — Other Ambulatory Visit: Payer: Self-pay | Admitting: Family Medicine

## 2020-12-31 ENCOUNTER — Other Ambulatory Visit: Payer: Self-pay | Admitting: Family Medicine

## 2020-12-31 DIAGNOSIS — B001 Herpesviral vesicular dermatitis: Secondary | ICD-10-CM

## 2021-01-17 ENCOUNTER — Ambulatory Visit: Payer: BLUE CROSS/BLUE SHIELD | Admitting: Cardiology

## 2021-02-28 DIAGNOSIS — G43719 Chronic migraine without aura, intractable, without status migrainosus: Secondary | ICD-10-CM | POA: Diagnosis not present

## 2021-02-28 DIAGNOSIS — G40209 Localization-related (focal) (partial) symptomatic epilepsy and epileptic syndromes with complex partial seizures, not intractable, without status epilepticus: Secondary | ICD-10-CM | POA: Diagnosis not present

## 2021-02-28 DIAGNOSIS — Z79899 Other long term (current) drug therapy: Secondary | ICD-10-CM | POA: Diagnosis not present

## 2021-03-21 DIAGNOSIS — H40033 Anatomical narrow angle, bilateral: Secondary | ICD-10-CM | POA: Diagnosis not present

## 2021-03-21 DIAGNOSIS — H04123 Dry eye syndrome of bilateral lacrimal glands: Secondary | ICD-10-CM | POA: Diagnosis not present

## 2021-09-20 ENCOUNTER — Other Ambulatory Visit: Payer: Self-pay | Admitting: Family Medicine

## 2021-09-22 NOTE — Telephone Encounter (Signed)
[facility]. NTBS last OV & labs 10/2020 mail order NOT sent ?

## 2021-09-22 NOTE — Telephone Encounter (Signed)
Made appt for 5/8 ?

## 2021-10-13 ENCOUNTER — Ambulatory Visit (INDEPENDENT_AMBULATORY_CARE_PROVIDER_SITE_OTHER): Payer: Self-pay | Admitting: Family Medicine

## 2021-10-13 ENCOUNTER — Encounter: Payer: Self-pay | Admitting: Family Medicine

## 2021-10-13 VITALS — BP 135/85 | HR 78 | Temp 97.4°F | Ht 64.0 in | Wt 269.4 lb

## 2021-10-13 DIAGNOSIS — E034 Atrophy of thyroid (acquired): Secondary | ICD-10-CM

## 2021-10-13 DIAGNOSIS — B001 Herpesviral vesicular dermatitis: Secondary | ICD-10-CM

## 2021-10-13 MED ORDER — VALACYCLOVIR HCL 1 G PO TABS: ORAL_TABLET | ORAL | 1 refills | Status: DC

## 2021-10-13 NOTE — Progress Notes (Signed)
? ?Subjective: ?CC: Hypothyroidism ?PCP: Sherri Norlander, DO ?XHB:Sherri Patel is a 46 y.o. female presenting to clinic today for: ? ?1.  Hypothyroidism ?Patient reports compliance with her Synthroid 150 mcg daily.  She does report occasional palpitation, fatigue.  She feels like her thyroid level may be slightly subtherapeutic.  She wants to advance her dose even if it is on the low side of normal as she does feel better when her thyroid levels are at the higher end of normal. ? ?ROS: Per HPI ? ?Allergies  ?Allergen Reactions  ? Penicillins   ?  Upset stomach ?Has patient had a PCN reaction causing immediate rash, facial/tongue/throat swelling, SOB or lightheadedness with hypotension: No ?Has patient had a PCN reaction causing severe rash involving mucus membranes or skin necrosis: No ?Has patient had a PCN reaction that required hospitalization: No ?Has patient had a PCN reaction occurring within the last 10 years: Yes ?If all of the above answers are "NO", then may proceed with Cephalosporin use. ?  ? ?Past Medical History:  ?Diagnosis Date  ? Allergy   ? Dyslipidemia   ? Epilepsy ([place])   ? Hyperlipidemia   ? Hypothyroidism   ? Overweight(278.02)   ? Seizures ([place])   ? SOB (shortness of breath)   ? Stress   ? ? ?Current Outpatient Medications:  ?  acetaminophen (TYLENOL) 500 MG tablet, Take 1,000 mg by mouth daily as needed for moderate pain or headache., Disp: , Rfl:  ?  hydrOXYzine (ATARAX/VISTARIL) 50 MG tablet, Take 0.5-1 tablets (25-50 mg total) by mouth every 8 (eight) hours as needed for anxiety or itching., Disp: 90 tablet, Rfl: 0 ?  ibuprofen (ADVIL,MOTRIN) 200 MG tablet, Take 400 mg by mouth daily as needed for headache or moderate pain., Disp: , Rfl:  ?  lamoTRIgine (LAMICTAL) 150 MG tablet, Take 150 mg by mouth 2 (two) times daily. , Disp: , Rfl:  ?  levETIRAcetam (KEPPRA) 750 MG tablet, Take 750 mg by mouth 2 (two) times daily., Disp: , Rfl:  ?  levocetirizine (XYZAL) 5 MG tablet, Take 1  tablet (5 mg total) by mouth every evening. For allergies, Disp: 90 tablet, Rfl: 3 ?  levothyroxine (SYNTHROID) 150 MCG tablet, TAKE 1 TABLET BY MOUTH  DAILY (EQUIVALENT TO  EUTHYROX), Disp: 90 tablet, Rfl: 2 ?  medroxyPROGESTERone (DEPO-PROVERA) 150 MG/ML injection, Inject 150 mg into the muscle every 3 (three) months., Disp: , Rfl:  ?  mometasone (NASONEX) 50 MCG/ACT nasal spray, Place 2 sprays into the nose daily., Disp: 3 each, Rfl: 2 ?  valACYclovir (VALTREX) 1000 MG tablet, TAKE 2 TABLETS BY MOUTH  TWICE DAILY FOR 1 DAY PER  FLARE. REPEAT WITH EACH  FLAREUP (Patient not taking: Reported on 10/13/2021), Disp: 20 tablet, Rfl: 1 ?  zolmitriptan (ZOMIG) 5 MG tablet, Take by mouth., Disp: , Rfl:  ?Social History  ? ?Socioeconomic History  ? Marital status: Married  ?  Spouse name: Not on file  ? Number of children: 3  ? Years of education: Not on file  ? Highest education level: Not on file  ?Occupational History  ?  Employer: UNEMPLOYED  ?Tobacco Use  ? Smoking status: Never  ? Smokeless tobacco: Never  ?Vaping Use  ? Vaping Use: Never used  ?Substance and Sexual Activity  ? Alcohol use: No  ? Drug use: No  ? Sexual activity: Yes  ?  Birth control/protection: Injection  ?Other Topics Concern  ? Not on file  ?Social History Narrative  ?  Not on file  ? ?Social Determinants of Health  ? ?Financial Resource Strain: Not on file  ?Food Insecurity: Not on file  ?Transportation Needs: Not on file  ?Physical Activity: Not on file  ?Stress: Not on file  ?Social Connections: Not on file  ?Intimate Partner Violence: Not on file  ? ?Family History  ?Problem Relation Age of Onset  ? COPD Mother   ? Cancer Mother   ? Diabetes Mother   ? Depression Mother   ? Bipolar disorder Mother   ? COPD Father   ? Cancer Father   ?     prostate  ? Hypertension Father   ? ? ?Objective: ?Office vital signs reviewed. ?BP 135/85   Pulse 78   Temp (!) 97.4 ?F (36.3 ?C)   Ht $R'5\' 4"'qj$  (1.626 m)   Wt 269 lb 6.4 oz (122.2 kg)   SpO2 98%   BMI 46.24  kg/m?  ? ?Physical Examination:  ?General: Awake, alert, well nourished, No acute distress ?HEENT: Sclera white.  No exophthalmos no goiter ?Cardio: regular rate and rhythm, S1S2 heard, no murmurs appreciated ?Pulm: clear to auscultation bilaterally, no wheezes, rhonchi or rales; normal work of breathing on room air ?Skin: dry; intact; no rashes or lesions; normal temperature ?Neuro no tremor ?Assessment/ Plan: ?46 y.o. female  ? ?Hypothyroidism due to acquired atrophy of thyroid - Plan: CMP14+EGFR, TSH, T4, Free ? ?Herpes labialis without complication - Plan: valACYclovir (VALTREX) 1000 MG tablet ? ?Somewhat fatigued.  We will check thyroid labs.  It sounds like she is concerned about a subclinical hypothyroidism.  If her TSH is on the high side and her free T4 is on the low side I would be glad to make a slight adjustment in efforts to achieve better control of her symptoms but we discussed that this would require a 6-week thyroid lab follow-up.  She was amenable to this ? ?No recent HSV flares.  Valtrex renewed for as needed use ? ?No orders of the defined types were placed in this encounter. ? ?No orders of the defined types were placed in this encounter. ? ? ? ?Sherri Norlander, DO ?[place] ?[phone] ? ? ?

## 2021-10-14 LAB — CMP14+EGFR
ALT: 19 IU/L (ref 0–32)
AST: 16 IU/L (ref 0–40)
Albumin/Globulin Ratio: 1.6 (ref 1.2–2.2)
Albumin: 4.1 g/dL (ref 3.8–4.8)
Alkaline Phosphatase: 91 IU/L (ref 44–121)
BUN/Creatinine Ratio: 7 — ABNORMAL LOW (ref 9–23)
BUN: 6 mg/dL (ref 6–24)
Bilirubin Total: 0.2 mg/dL (ref 0.0–1.2)
CO2: 22 mmol/L (ref 20–29)
Calcium: 9.7 mg/dL (ref 8.7–10.2)
Chloride: 105 mmol/L (ref 96–106)
Creatinine, Ser: 0.91 mg/dL (ref 0.57–1.00)
Globulin, Total: 2.5 g/dL (ref 1.5–4.5)
Glucose: 92 mg/dL (ref 70–99)
Potassium: 4.1 mmol/L (ref 3.5–5.2)
Sodium: 140 mmol/L (ref 134–144)
Total Protein: 6.6 g/dL (ref 6.0–8.5)
eGFR: 79 mL/min/{1.73_m2} (ref >59)

## 2021-10-14 LAB — TSH: TSH: 0.948 u[IU]/mL (ref 0.450–4.500)

## 2021-10-14 LAB — T4, FREE: Free T4: 1.57 ng/dL (ref 0.82–1.77)

## 2021-10-22 ENCOUNTER — Other Ambulatory Visit: Payer: Self-pay | Admitting: *Deleted

## 2021-10-22 MED ORDER — LEVOTHYROXINE SODIUM 150 MCG PO TABS
ORAL_TABLET | ORAL | 3 refills | Status: DC
Start: 2021-10-22 — End: 2022-10-12

## 2021-11-15 ENCOUNTER — Other Ambulatory Visit: Payer: Self-pay | Admitting: Family Medicine

## 2021-11-15 DIAGNOSIS — B001 Herpesviral vesicular dermatitis: Secondary | ICD-10-CM

## 2021-11-18 ENCOUNTER — Other Ambulatory Visit: Payer: Self-pay | Admitting: Family Medicine

## 2021-11-18 DIAGNOSIS — B001 Herpesviral vesicular dermatitis: Secondary | ICD-10-CM

## 2022-08-12 ENCOUNTER — Telehealth: Payer: Self-pay | Admitting: Physician Assistant

## 2022-08-12 DIAGNOSIS — J0110 Acute frontal sinusitis, unspecified: Secondary | ICD-10-CM

## 2022-08-12 MED ORDER — FLUTICASONE PROPIONATE 50 MCG/ACT NA SUSP: 2.0000 | Freq: Every day | NASAL | 0 refills | Status: DC

## 2022-08-12 MED ORDER — AZITHROMYCIN 250 MG PO TABS: ORAL_TABLET | ORAL | 0 refills | Status: DC

## 2022-08-12 NOTE — Progress Notes (Signed)
E-Visit for Sinus Problems  We are sorry that you are not feeling well.  Here is how we plan to help!  Based on what you have shared with me it looks like you have sinusitis.  Sinusitis is inflammation and infection in the sinus cavities of the head.  Based on your presentation I believe you most likely have Acute Viral Sinusitis.This is an infection most likely caused by a virus. There is not specific treatment for viral sinusitis other than to help you with the symptoms until the infection runs its course.  You may use an oral decongestant such as Mucinex D or if you have glaucoma or high blood pressure use plain Mucinex. Saline nasal spray help and can safely be used as often as needed for congestion, I have prescribed: Fluticasone nasal spray two sprays in each nostril once a day  Some authorities believe that zinc sprays or the use of Echinacea may shorten the course of your symptoms.  Sinus infections are not as easily transmitted as other respiratory infection, however we still recommend that you avoid close contact with loved ones, especially the very young and elderly.  Remember to wash your hands thoroughly throughout the day as this is the number one way to prevent the spread of infection!  This seems viral in nature in which case antibiotics will not help. However, I see your history of secondary ear infections with rupture so I will place a script on file for Azithromycin to start and take as directed if things are not easing up over the next 48 hours or any increased ear symptoms.   Home Care: Only take medications as instructed by your medical team. Do not take these medications with alcohol. A steam or ultrasonic humidifier can help congestion.  You can place a towel over your head and breathe in the steam from hot water coming from a faucet. Avoid close contacts especially the very young and the elderly. Cover your mouth when you cough or sneeze. Always remember to wash your  hands.  Get Help Right Away If: You develop worsening fever or sinus pain. You develop a severe head ache or visual changes. Your symptoms persist after you have completed your treatment plan.  Make sure you Understand these instructions. Will watch your condition. Will get help right away if you are not doing well or get worse.   Thank you for choosing an e-visit.  Your e-visit answers were reviewed by a board certified advanced clinical practitioner to complete your personal care plan. Depending upon the condition, your plan could have included both over the counter or prescription medications.  Please review your pharmacy choice. Make sure the pharmacy is open so you can pick up prescription now. If there is a problem, you may contact your provider through [employer] and have the prescription routed to another pharmacy.  Your safety is important to [place]. If you have drug allergies check your prescription carefully.   For the next 24 hours you can use MyChart to ask questions about today's visit, request a non-urgent call back, or ask for a work or school excuse. You will get an email in the next two days asking about your experience. I hope that your e-visit has been valuable and will speed your recovery.

## 2022-08-12 NOTE — Progress Notes (Signed)
I have spent 5 minutes in review of e-visit questionnaire, review and updating patient chart, medical decision making and response to patient.   Keison Glendinning Cody Ishika Chesterfield, PA-C    

## 2022-10-10 ENCOUNTER — Other Ambulatory Visit: Payer: Self-pay | Admitting: Family Medicine

## 2022-11-14 ENCOUNTER — Other Ambulatory Visit: Payer: Self-pay | Admitting: Family Medicine

## 2023-01-05 ENCOUNTER — Ambulatory Visit (INDEPENDENT_AMBULATORY_CARE_PROVIDER_SITE_OTHER): Payer: Self-pay | Admitting: Family Medicine

## 2023-01-05 ENCOUNTER — Encounter: Payer: Self-pay | Admitting: Family Medicine

## 2023-01-05 DIAGNOSIS — M25561 Pain in right knee: Secondary | ICD-10-CM

## 2023-01-05 DIAGNOSIS — R609 Edema, unspecified: Secondary | ICD-10-CM

## 2023-01-05 DIAGNOSIS — G43009 Migraine without aura, not intractable, without status migrainosus: Secondary | ICD-10-CM

## 2023-01-05 DIAGNOSIS — E034 Atrophy of thyroid (acquired): Secondary | ICD-10-CM

## 2023-01-05 DIAGNOSIS — Z5986 Financial insecurity: Secondary | ICD-10-CM

## 2023-01-05 DIAGNOSIS — M25562 Pain in left knee: Secondary | ICD-10-CM

## 2023-01-05 DIAGNOSIS — Z713 Dietary counseling and surveillance: Secondary | ICD-10-CM

## 2023-01-05 DIAGNOSIS — G8929 Other chronic pain: Secondary | ICD-10-CM

## 2023-01-05 LAB — BAYER DCA HB A1C WAIVED: HB A1C (BAYER DCA - WAIVED): 5.9 % — ABNORMAL HIGH (ref 4.8–5.6)

## 2023-01-05 MED ORDER — UBRELVY 100 MG PO TABS
100.0000 mg | ORAL_TABLET | Freq: Every day | ORAL | Status: DC | PRN
Start: 2023-01-05 — End: 2023-01-06

## 2023-01-05 NOTE — Patient Instructions (Signed)
Consider copper IUD for contraception.  Depo after 5 years is associated with bone loss/ osteoporosis

## 2023-01-05 NOTE — Progress Notes (Signed)
Subjective: CC: Multiple concerns PCP: Raliegh Ip, DO [id number] C Cwikla is a 47 y.o. female presenting to clinic today for:  1.  Migraine headaches Patient reports that she no longer has insurance due to the excessive cost of insurance.  She was previously prescribed Zomig for as needed use of her migraine headaches but notes this is entirely unaffordable now.  She continues to see neurology for her seizure disorder and is treated with Keppra and Lamictal.  2.  Weight gain Patient reports quite a bit of weight gain.  She does suffer from hypothyroidism and thyroid levels have not been checked in a while.  She notes that she now is also experiencing some swelling and tingling in bilateral hands.  Admits that she does not follow a strict diet nor has any exercise regimen that she follows.  She typically cares for her grandson and just eats what ever is available.  She is ultimately interested in seeing an endocrinologist if possible  3.  Knee pain Patient reports history of right knee surgery, ACL repair.  She has been having pain in her left knee but it is slightly better today.  She has been taking oral NSAIDs and analgesics if needed.  She notes that the pain started after she did a deep squat and she felt like it start becoming unstable.  She does not report any falls.  4.  Contraception Patient is currently on Depo-Provera that she gets from the health department.  She has been treated with this for over 12 years now.  She had been told that the oral contraceptive that she was on for 3 years while being treated for epilepsy would in fact cause issues with her epilepsy medication.  She had never been told this by her neurologist but was told this by the health department and therefore she was transitioned over to Depo-Provera.   ROS: Per HPI  Allergies  Allergen Reactions   Penicillins     Upset stomach Has patient had a PCN reaction causing immediate rash,  facial/tongue/throat swelling, SOB or lightheadedness with hypotension: No Has patient had a PCN reaction causing severe rash involving mucus membranes or skin necrosis: No Has patient had a PCN reaction that required hospitalization: No Has patient had a PCN reaction occurring within the last 10 years: Yes If all of the above answers are "NO", then may proceed with Cephalosporin use.    Past Medical History:  Diagnosis Date   Allergy    Dyslipidemia    Epilepsy (HCC)    Hyperlipidemia    Hypothyroidism    Overweight(278.02)    Seizures (HCC)    SOB (shortness of breath)    Stress     Current Outpatient Medications:    acetaminophen (TYLENOL) 500 MG tablet, Take 1,000 mg by mouth daily as needed for moderate pain or headache., Disp: , Rfl:    azithromycin (ZITHROMAX) 250 MG tablet, Take 2 tablets on day 1, then 1 tablet daily on days 2 through 5, Disp: 6 tablet, Rfl: 0   fluticasone (FLONASE) 50 MCG/ACT nasal spray, Place 2 sprays into both nostrils daily., Disp: 16 g, Rfl: 0   hydrOXYzine (ATARAX/VISTARIL) 50 MG tablet, Take 0.5-1 tablets (25-50 mg total) by mouth every 8 (eight) hours as needed for anxiety or itching., Disp: 90 tablet, Rfl: 0   ibuprofen (ADVIL,MOTRIN) 200 MG tablet, Take 400 mg by mouth daily as needed for headache or moderate pain., Disp: , Rfl:    lamoTRIgine (LAMICTAL) 150 MG  tablet, Take 150 mg by mouth 2 (two) times daily. , Disp: , Rfl:    levETIRAcetam (KEPPRA) 750 MG tablet, Take 750 mg by mouth 2 (two) times daily., Disp: , Rfl:    levocetirizine (XYZAL) 5 MG tablet, Take 1 tablet (5 mg total) by mouth every evening. For allergies, Disp: 90 tablet, Rfl: 3   levothyroxine (SYNTHROID) 150 MCG tablet, TAKE 1 TABLET BY MOUTH ONCE DAILY., Disp: 90 tablet, Rfl: 0   medroxyPROGESTERone (DEPO-PROVERA) 150 MG/ML injection, Inject 150 mg into the muscle every 3 (three) months., Disp: , Rfl:    Ubrogepant (UBRELVY) 100 MG TABS, Take 1 tablet (100 mg total) by mouth  daily as needed (migrain)., Disp: , Rfl:    valACYclovir (VALTREX) 1000 MG tablet, TAKE 2 TABLETS BY MOUTH  TWICE DAILY FOR 1 DAY PER  FLARE. REPEAT WITH EACH  FLAREUP, Disp: 20 tablet, Rfl: 1 Social History   Socioeconomic History   Marital status: Married    Spouse name: Not on file   Number of children: 3   Years of education: Not on file   Highest education level: Not on file  Occupational History    Employer: UNEMPLOYED  Tobacco Use   Smoking status: Never   Smokeless tobacco: Never  Vaping Use   Vaping status: Never Used  Substance and Sexual Activity   Alcohol use: No   Drug use: No   Sexual activity: Yes    Birth control/protection: Injection  Other Topics Concern   Not on file  Social History Narrative   Not on file   Social Determinants of Health   Financial Resource Strain: Not on file  Food Insecurity: Not on file  Transportation Needs: Not on file  Physical Activity: Not on file  Stress: Not on file  Social Connections: Not on file  Intimate Partner Violence: Not on file   Family History  Problem Relation Age of Onset   COPD Mother    Cancer Mother    Diabetes Mother    Depression Mother    Bipolar disorder Mother    COPD Father    Cancer Father        prostate   Hypertension Father     Objective: Office vital signs reviewed. BP 126/81   Pulse 75   Temp 98.7 F (37.1 C)   Ht 5\' 4"  (1.626 m)   Wt 271 lb (122.9 kg)   SpO2 96%   BMI 46.52 kg/m   Physical Examination:  General: Awake, alert, morbidly obese, No acute distress HEENT: No exophthalmos. no goiter Cardio: regular rate and rhythm, S1S2 heard, no murmurs appreciated Pulm: clear to auscultation bilaterally, no wheezes, rhonchi or rales; normal work of breathing on room air Neuro: Alert and oriented.  No nystagmus.   Assessment/ Plan: 47 y.o. female   Morbid obesity (HCC) - Plan: Bayer DCA Hb A1c Waived, Ambulatory referral to Endocrinology  Hypothyroidism due to acquired  atrophy of thyroid - Plan: T4, free, TSH, Ambulatory referral to Endocrinology  Chronic pain of both knees  Weight loss counseling, encounter for  Patient cannot afford medications  Migraine without aura and without status migrainosus, not intractable - Plan: Ubrogepant (UBRELVY) 100 MG TABS, AMB Referral to Pharmacy Medication Management  Edema, unspecified type  Counseled on possible GIP versus GLP compounded.  Ongoing inquire as to how much impact this may have on her epilepsy medication since this is her narrow therapeutic drugs.  I have placed a referral to endocrinology for the hypothyroidism per  her request.  Will check thyroid labs as I do question if perhaps this may be contributing to some of the swelling and weight gain she is been experiencing.  I also counseled her on appropriate diet though she insisted she only lost 6 pounds when she was on it a potato and chicken diet many years ago.  I certainly think that the chronic pain in both knees are likely meniscal in nature and compounded by morbid obesity.  Plan for referral to orthopedics at some point.  Okay to utilize topical analgesics, oral NSAIDs and oral Tylenol if needed as this has been somewhat helpful  I will reach out to [facility] pharmacist to see if perhaps there are any options for her.  She is established with the health department for birth control.  I have encouraged her to discuss with them if perhaps she may be a candidate for medication assistance there now that she is uninsured.  I also encouraged her to come off of Depo-Provera due to morbid obesity and association with development of osteoporosis.  She has been on this medication for well over 12 years and I think she is far past the recommended treatment schedule.  She would likely be a better candidate for IUD insertion.  I gave her information on this today  Sample of Bernita Raisin provided to her today.  I will coordinate with the clinical pharmacist to see if perhaps  there are any patient assistance programs for these types of medications.  Orders Placed This Encounter  Procedures   T4, free   TSH   Bayer DCA Hb A1c Waived   Meds ordered this encounter  Medications   Ubrogepant (UBRELVY) 100 MG TABS    Sig: Take 1 tablet (100 mg total) by mouth daily as needed (migrain).     Raliegh Ip, DO [facility] [phone]

## 2023-01-06 ENCOUNTER — Other Ambulatory Visit: Payer: Self-pay | Admitting: Family Medicine

## 2023-01-06 DIAGNOSIS — E034 Atrophy of thyroid (acquired): Secondary | ICD-10-CM

## 2023-01-06 DIAGNOSIS — G43009 Migraine without aura, not intractable, without status migrainosus: Secondary | ICD-10-CM

## 2023-01-06 MED ORDER — SEMAGLUTIDE (2 MG/DOSE) 8 MG/3ML ~~LOC~~ SOPN
PEN_INJECTOR | SUBCUTANEOUS | Status: DC
Start: 2023-01-06 — End: 2023-04-23

## 2023-01-06 MED ORDER — ZOLMITRIPTAN 5 MG PO TABS
5.0000 mg | ORAL_TABLET | ORAL | 99 refills | Status: DC | PRN
Start: 2023-01-06 — End: 2023-02-12

## 2023-01-06 MED ORDER — LEVOTHYROXINE SODIUM 175 MCG PO TABS: 175.0000 ug | ORAL_TABLET | Freq: Every day | ORAL | 0 refills | Status: DC

## 2023-01-08 ENCOUNTER — Encounter: Payer: Self-pay | Admitting: Pharmacist

## 2023-01-12 ENCOUNTER — Telehealth: Payer: Self-pay

## 2023-01-12 NOTE — Progress Notes (Signed)
   Care Guide Note  01/12/2023 Name: Sherri Patel MRN: [id number] DOB: 11/14/1975  Referred by: Raliegh Ip, DO Reason for referral : Care Coordination (Outreach to schedule with pharm d )   Sherri Patel is a 47 y.o. year old female who is a primary care patient of Raliegh Ip, DO. Sherri Patel was referred to the pharmacist for assistance related to  Med assistance  .    Successful contact was made with the patient to discuss pharmacy services including being ready for the pharmacist to call at least 5 minutes before the scheduled appointment time, to have medication bottles and any blood sugar or blood pressure readings ready for review. The patient agreed to meet with the pharmacist via with the pharmacist via telephone visit on (date/time).  02/04/2023  Sherri Patel, RMA Care Guide [facility]  [place], [place] [address] Direct Dial: [phone] [email or web address]

## 2023-01-19 NOTE — Telephone Encounter (Signed)
Pt calling to let PCP know that [facility] pharmacy is still waiting to receive her weight loss Rx.

## 2023-01-20 ENCOUNTER — Encounter: Payer: Self-pay | Admitting: Family Medicine

## 2023-02-04 ENCOUNTER — Ambulatory Visit: Payer: Self-pay

## 2023-02-12 ENCOUNTER — Ambulatory Visit (INDEPENDENT_AMBULATORY_CARE_PROVIDER_SITE_OTHER): Payer: Self-pay | Admitting: Pharmacist

## 2023-02-12 DIAGNOSIS — G43009 Migraine without aura, not intractable, without status migrainosus: Secondary | ICD-10-CM

## 2023-02-12 DIAGNOSIS — B001 Herpesviral vesicular dermatitis: Secondary | ICD-10-CM

## 2023-02-12 MED ORDER — VALACYCLOVIR HCL 1 G PO TABS: ORAL_TABLET | ORAL | 1 refills | Status: DC

## 2023-02-12 MED ORDER — ZOLMITRIPTAN 5 MG PO TABS: 5.0000 mg | ORAL_TABLET | ORAL | 99 refills | Status: AC | PRN

## 2023-02-12 MED ORDER — VALACYCLOVIR HCL 1 G PO TABS
ORAL_TABLET | ORAL | 1 refills | Status: DC
Start: 2023-02-12 — End: 2023-08-04

## 2023-02-12 NOTE — Progress Notes (Signed)
02/12/2023 Name: Sherri Patel MRN: [id number] DOB: Jan 14, 1976  Chief Complaint  Patient presents with   Medication Management    Sherri Patel is a 47 y.o. year old female who was referred for medication management by their primary care provider, Delynn Flavin M, DO. They presented for a face to face visit today.   They were referred to the pharmacist by their PCP for assistance in managing medication access.  Patient reports loss of insurance (temporary) and she is unable to get certain medications due to cost.  Subjective:  Care Team: Primary Care Provider: Raliegh Ip, DO   Medication Access/Adherence  Current Pharmacy:  [facility] [address], [place] - [address] [facility] [place] [address] Phone: [phone] Fax: [phone]  [facility]) - [place], [place] - [address] [facility] [address] [place] [address] [place] [address] Phone: [phone] Fax: [phone]  [facility] - [place], [place] - [address] [place] [address] Phone: [phone] Fax: [phone]   Patient reports affordability concerns with their medications: Yes  Patient reports access/transportation concerns to their pharmacy: No  Patient reports adherence concerns with their medications:  No      Objective:  Lab Results  Component Value Date   HGBA1C 5.9 (H) 01/05/2023    Lab Results  Component Value Date   CREATININE 0.91 10/13/2021   BUN 6 10/13/2021   NA 140 10/13/2021   K 4.1 10/13/2021   CL 105 10/13/2021   CO2 22 10/13/2021    Lab Results  Component Value Date   CHOL 186 10/25/2020   HDL 55 10/25/2020   LDLCALC 116 (H) 10/25/2020   TRIG 82 10/25/2020   CHOLHDL 3.4 10/25/2020    Medications Reviewed Today     Reviewed by Danella Maiers, [facility] (Pharmacist) on 02/12/23 at 1438  Med List Status: <None>   Medication Order Taking? Sig  Documenting Provider Last Dose Status Informant  acetaminophen (TYLENOL) 500 MG tablet [id number] No Take 1,000 mg by mouth daily as needed for moderate pain or headache. [provider] Taking Active Self  azithromycin (ZITHROMAX) 250 MG tablet [id number] No Take 2 tablets on day 1, then 1 tablet daily on days 2 through 5 Waldon Merl, [place] Taking Active   fluticasone (FLONASE) 50 MCG/ACT nasal spray [id number] No Place 2 sprays into both nostrils daily. Waldon Merl, PA-C Taking Active   hydrOXYzine (ATARAX/VISTARIL) 50 MG tablet [id number] No Take 0.5-1 tablets (25-50 mg total) by mouth every 8 (eight) hours as needed for anxiety or itching. Delynn Flavin M, DO Taking Active   ibuprofen (ADVIL,MOTRIN) 200 MG tablet [id number] No Take 400 mg by mouth daily as needed for headache or moderate pain. [provider] Taking Active Self  lamoTRIgine (LAMICTAL) 150 MG tablet [id number] No Take 150 mg by mouth 2 (two) times daily.  [provider] Taking Active Self  levETIRAcetam (KEPPRA) 750 MG tablet [id number] No Take 750 mg by mouth 2 (two) times daily. [provider] Taking Active Self  levocetirizine (XYZAL) 5 MG tablet [id number] No Take 1 tablet (5 mg total) by mouth every evening. For allergies Delynn Flavin M, DO Taking Active   levothyroxine (SYNTHROID) 175 MCG tablet [id number]  Take 1 tablet (175 mcg total) by mouth daily before breakfast. *dose change. Needs repeat lab in Sept/Oct Delynn Flavin M, DO  Active   medroxyPROGESTERone (  DEPO-PROVERA) 150 MG/ML injection [id number] No Inject 150 mg into the muscle every 3 (three) months. [provider] Taking Active Self  Semaglutide, 2 MG/DOSE, 8 MG/3ML SOPN [id number]  Compounded rx from [facility] drug.  Inject subcutaneous every 7d. Titrate monthly as directed. Delynn Flavin M, DO  Active   valACYclovir (VALTREX) 1000 MG tablet [id number] No TAKE 2 TABLETS BY MOUTH  TWICE DAILY FOR 1 DAY PER   FLARE. REPEAT WITH EACH  FLAREUP Sherri Patel, [place] M, [place] Taking Active   zolmitriptan (ZOMIG) 5 MG tablet [id number]  Take 1 tablet (5 mg total) by mouth as needed for migraine. Delynn Flavin M, DO  Active              Assessment/Plan:   Call placed to [facility] [place]-->Good rx for zolmitriptan <$30 Refills sent to mail order for remaining meds Patient to gain insurance back this fall Do not want to adjust migraine meds as patient is stable and also on seizure medications Encouraged patient to call PharmD if additional medication assistance support is needed   Kieth Brightly, PharmD, BCACP Clinical Pharmacist, [facility]

## 2023-02-17 ENCOUNTER — Other Ambulatory Visit: Payer: Self-pay

## 2023-02-17 DIAGNOSIS — E034 Atrophy of thyroid (acquired): Secondary | ICD-10-CM

## 2023-02-18 LAB — TSH: TSH: 0.285 u[IU]/mL — ABNORMAL LOW (ref 0.450–4.500)

## 2023-02-18 LAB — T4, FREE: Free T4: 1.88 ng/dL — ABNORMAL HIGH (ref 0.82–1.77)

## 2023-02-19 ENCOUNTER — Other Ambulatory Visit: Payer: Self-pay | Admitting: Family Medicine

## 2023-02-19 ENCOUNTER — Encounter: Payer: Self-pay | Admitting: Family Medicine

## 2023-02-19 DIAGNOSIS — E034 Atrophy of thyroid (acquired): Secondary | ICD-10-CM

## 2023-02-19 MED ORDER — LEVOTHYROXINE SODIUM 150 MCG PO TABS: 150.0000 ug | ORAL_TABLET | Freq: Every day | ORAL | 0 refills | Status: DC

## 2023-03-05 ENCOUNTER — Encounter: Payer: Self-pay | Admitting: Family Medicine

## 2023-03-05 DIAGNOSIS — Z30011 Encounter for initial prescription of contraceptive pills: Secondary | ICD-10-CM

## 2023-03-05 DIAGNOSIS — E034 Atrophy of thyroid (acquired): Secondary | ICD-10-CM

## 2023-03-08 MED ORDER — LEVOTHYROXINE SODIUM 150 MCG PO TABS
ORAL_TABLET | ORAL | Status: DC
Start: 2023-03-08 — End: 2023-08-13

## 2023-03-08 MED ORDER — LEVOTHYROXINE SODIUM 175 MCG PO TABS: 175.0000 ug | ORAL_TABLET | Freq: Every day | ORAL | 3 refills | Status: DC

## 2023-03-08 NOTE — Telephone Encounter (Signed)
Please see the MyChart message reply(ies) for my assessment and plan.    This patient gave consent for this Medical Advice Message and is aware that it may result in a bill to [employer], as well as the possibility of receiving a bill for a co-payment or deductible. They are an established patient, but are not seeking medical advice exclusively about a problem treated during an in person or video visit in the last seven days. I did not recommend an in person or video visit within seven days of my reply.    I spent a total of 6 minutes cumulative time within 7 days through [employer].  Delynn Flavin, DO

## 2023-03-09 MED ORDER — NORGESTIMATE-ETH ESTRADIOL 0.25-35 MG-MCG PO TABS
1.0000 | ORAL_TABLET | Freq: Every day | ORAL | 4 refills | Status: DC
Start: 2023-03-09 — End: 2023-04-23

## 2023-03-09 NOTE — Addendum Note (Signed)
Addended by: Raliegh Ip on: 03/09/2023 08:05 AM   Modules accepted: Orders

## 2023-04-21 ENCOUNTER — Encounter: Payer: Self-pay | Admitting: Family Medicine

## 2023-04-23 ENCOUNTER — Encounter: Payer: Self-pay | Admitting: Family Medicine

## 2023-04-23 ENCOUNTER — Ambulatory Visit (INDEPENDENT_AMBULATORY_CARE_PROVIDER_SITE_OTHER): Payer: Self-pay | Admitting: Family Medicine

## 2023-04-23 VITALS — BP 119/72 | HR 79 | Temp 98.8°F | Ht 64.0 in | Wt 268.0 lb

## 2023-04-23 DIAGNOSIS — N644 Mastodynia: Secondary | ICD-10-CM

## 2023-04-23 DIAGNOSIS — Z1331 Encounter for screening for depression: Secondary | ICD-10-CM

## 2023-04-23 DIAGNOSIS — R9230 Dense breasts, unspecified: Secondary | ICD-10-CM

## 2023-04-23 DIAGNOSIS — L0291 Cutaneous abscess, unspecified: Secondary | ICD-10-CM

## 2023-04-23 DIAGNOSIS — N6323 Unspecified lump in the left breast, lower outer quadrant: Secondary | ICD-10-CM

## 2023-04-23 NOTE — Progress Notes (Signed)
Subjective:  Patient ID: Sherri Patel, female    DOB: [birth year], 47 y.o.   MRN: [id number]  Patient Care Team: Raliegh Ip, DO as PCP - General (Family Medicine)   Chief Complaint:  Breast Pain (Left - underneath - hard - stayed the same size , has not moved at all ) and Cyst (This has been here for a few weeks now )  HPI: Sherri Patel is a 47 y.o. female presenting on 04/23/2023 for Breast Pain (Left - underneath - hard - stayed the same size , has not moved at all ) and Cyst (This has been here for a few weeks now ) States that she noticed a knot on her back years ago. States that it was small and now is bigger. States that she has noticed pain from her back about 1-2 months ago. Started having pain under left breast 2 weeks ago and noticed a lump. Is taking pain medication, tylenol, which helps some. States that she was told she had cystic breast as a teenager and instructed to avoid caffeine then. She has recently stopped Depo for contraception.   Relevant past medical, surgical, family, and social history reviewed and updated as indicated.  Allergies and medications reviewed and updated. Data reviewed: Chart in Epic.   Past Medical History:  Diagnosis Date   Allergy    Dyslipidemia    Epilepsy (HCC)    Hyperlipidemia    Hypothyroidism    Overweight(278.02)    Seizures (HCC)    SOB (shortness of breath)    Stress     Past Surgical History:  Procedure Laterality Date   CHOLECYSTECTOMY N/A 01/19/2018   Procedure: LAPAROSCOPIC CHOLECYSTECTOMY;  Surgeon: Lucretia Roers, MD;  Location: [facility];  Service: General;  Laterality: N/A;   KNEE SURGERY Right    Resection of Right dorsal ganglion Right    wrist   WRIST SURGERY Right     Social History   Socioeconomic History   Marital status: Married    Spouse name: Not on file   Number of children: 3   Years of education: Not on file   Highest education level: Not on file  Occupational History     Employer: UNEMPLOYED  Tobacco Use   Smoking status: Never   Smokeless tobacco: Never  Vaping Use   Vaping status: Never Used  Substance and Sexual Activity   Alcohol use: No   Drug use: No   Sexual activity: Yes    Birth control/protection: Injection  Other Topics Concern   Not on file  Social History Narrative   Not on file   Social Determinants of Health   Financial Resource Strain: Not on file  Food Insecurity: Not on file  Transportation Needs: Not on file  Physical Activity: Not on file  Stress: Not on file  Social Connections: Not on file  Intimate Partner Violence: Not on file    Outpatient Encounter Medications as of 04/23/2023  Medication Sig   acetaminophen (TYLENOL) 500 MG tablet Take 1,000 mg by mouth daily as needed for moderate pain or headache.   azithromycin (ZITHROMAX) 250 MG tablet Take 2 tablets on day 1, then 1 tablet daily on days 2 through 5   fluticasone (FLONASE) 50 MCG/ACT nasal spray Place 2 sprays into both nostrils daily.   hydrOXYzine (ATARAX/VISTARIL) 50 MG tablet Take 0.5-1 tablets (25-50 mg total) by mouth every 8 (eight) hours as needed for anxiety or itching.   ibuprofen (ADVIL,MOTRIN) 200  MG tablet Take 400 mg by mouth daily as needed for headache or moderate pain.   lamoTRIgine (LAMICTAL) 150 MG tablet Take 150 mg by mouth 2 (two) times daily.    levETIRAcetam (KEPPRA) 750 MG tablet Take 750 mg by mouth 2 (two) times daily.   levocetirizine (XYZAL) 5 MG tablet Take 1 tablet (5 mg total) by mouth every evening. For allergies   levothyroxine (SYNTHROID) 150 MCG tablet Take 1 tablet daily. Alternate with every other day.   levothyroxine (SYNTHROID) 175 MCG tablet Take 1 tablet (175 mcg total) by mouth daily. Alternate with every other day.   valACYclovir (VALTREX) 1000 MG tablet TAKE 2 TABLETS BY MOUTH  TWICE DAILY FOR 1 DAY PER  FLARE. REPEAT WITH EACH  FLAREUP   zolmitriptan (ZOMIG) 5 MG tablet Take 1 tablet (5 mg total) by  mouth as needed for migraine.   [DISCONTINUED] Semaglutide, 2 MG/DOSE, 8 MG/3ML SOPN Compounded rx from [facility] drug.  Inject subcutaneous every 7d. Titrate monthly as directed.   [DISCONTINUED] norgestimate-ethinyl estradiol (ORTHO-CYCLEN) 0.25-35 MG-MCG tablet Take 1 tablet by mouth daily. Use backup method of contraception while taking. Patient aware of drug interaction with seizure meds. (Patient not taking: Reported on 04/23/2023)   No facility-administered encounter medications on file as of 04/23/2023.    Allergies  Allergen Reactions   Penicillins     Upset stomach Has patient had a PCN reaction causing immediate rash, facial/tongue/throat swelling, SOB or lightheadedness with hypotension: No Has patient had a PCN reaction causing severe rash involving mucus membranes or skin necrosis: No Has patient had a PCN reaction that required hospitalization: No Has patient had a PCN reaction occurring within the last 10 years: Yes If all of the above answers are "NO", then may proceed with Cephalosporin use.     Review of Systems As per HPI Objective:  BP 119/72   Pulse 79   Temp 98.8 F (37.1 C)   Ht 5\' 4"  (1.626 m)   Wt 268 lb (121.6 kg)   SpO2 96%   BMI 46.00 kg/m    Wt Readings from Last 3 Encounters:  04/23/23 268 lb (121.6 kg)  01/05/23 271 lb (122.9 kg)  10/13/21 269 lb 6.4 oz (122.2 kg)    Physical Exam Constitutional:      General: She is awake. She is not in acute distress.    Appearance: Normal appearance. She is well-developed and well-groomed. She is not ill-appearing, toxic-appearing or diaphoretic.  Cardiovascular:     Rate and Rhythm: Normal rate and regular rhythm.     Pulses: Normal pulses.          Radial pulses are 2+ on the right side and 2+ on the left side.       Posterior tibial pulses are 2+ on the right side and 2+ on the left side.     Heart sounds: Normal heart sounds. No murmur heard.    No gallop.  Pulmonary:     Effort: Pulmonary effort is  normal. No respiratory distress.     Breath sounds: Normal breath sounds. No stridor. No wheezing, rhonchi or rales.  Chest:     Chest wall: Mass present.  Breasts:    Breasts are symmetrical.     Right: No swelling, bleeding, inverted nipple, mass, nipple discharge, skin change or tenderness.     Left: Mass and tenderness present. No swelling, bleeding, inverted nipple, nipple discharge or skin change.       Comments: ~2 inch  nodule on lower left breast tissue Denies breast tissue throughout bilateral breast   Musculoskeletal:     Cervical back: Full passive range of motion without pain and neck supple.     Right lower leg: No edema.     Left lower leg: No edema.  Skin:    General: Skin is warm.     Capillary Refill: Capillary refill takes less than 2 seconds.     Findings: Abscess present. No rash. Rash is not crusting, macular, nodular, papular, purpuric, pustular, scaling, urticarial or vesicular.          Comments: ~90mm round, raised, nodule draining serosanguinous fluid   Neurological:     General: No focal deficit present.     Mental Status: She is alert, oriented to person, place, and time and easily aroused. Mental status is at baseline.     GCS: GCS eye subscore is 4. GCS verbal subscore is 5. GCS motor subscore is 6.     Motor: No weakness.  Psychiatric:        Attention and Perception: Attention and perception normal.        Mood and Affect: Mood and affect normal.        Speech: Speech normal.        Behavior: Behavior normal. Behavior is cooperative.        Thought Content: Thought content normal. Thought content does not include homicidal or suicidal ideation. Thought content does not include homicidal or suicidal plan.        Cognition and Memory: Cognition and memory normal.        Judgment: Judgment normal.     Results for orders placed or performed in visit on 02/17/23  T4, Free  Result Value Ref Range   Free T4 1.88 (H) 0.82 - 1.77 ng/dL  TSH  Result  Value Ref Range   TSH 0.285 (L) 0.450 - 4.500 uIU/mL       04/23/2023    3:02 PM 01/05/2023    2:20 PM 10/13/2021    3:48 PM 10/13/2021    3:47 PM 10/25/2020    1:58 PM  Depression screen PHQ 2/9  Decreased Interest 1 2 0 0 1  Down, Depressed, Hopeless 1 2 0 0 1  PHQ - 2 Score 2 4 0 0 2  Altered sleeping 3 3 3  1   Tired, decreased energy 3 3 3  1   Change in appetite 2 3 0  0  Feeling bad or failure about yourself  2 2 0  0  Trouble concentrating 0 0 0  0  Moving slowly or fidgety/restless 0 0 0  0  Suicidal thoughts 0 0 0  0  PHQ-9 Score 12 15 6  4   Difficult doing work/chores Not difficult at all Not difficult at all Not difficult at all  Not difficult at all       04/23/2023    3:03 PM 01/05/2023    2:19 PM 10/13/2021    3:47 PM 10/25/2020    1:59 PM  GAD 7 : Generalized Anxiety Score  Nervous, Anxious, on Edge 2 2 0 0  Control/stop worrying 2 3 0 1  Worry too much - different things 2 2 3  0  Trouble relaxing 2 1 0 0  Restless 2 1 0 0  Easily annoyed or irritable 2 2 0 1  Afraid - awful might happen 2 2 0 1  Total GAD 7 Score 14 13 3 3   Anxiety Difficulty Not difficult  at all  Not difficult at all Not difficult at all   Pertinent labs & imaging results that were available during my care of the patient were reviewed by me and considered in my medical decision making.  Assessment & Plan:  Dilan was seen today for breast pain and cyst.  Diagnoses and all orders for this visit:  Breast pain, left Discussed with patient that likely cystic breast tissue. Patient recently stopped Depo injections, which may be contributing to change in symptoms. Discussed limiting caffeine and increasing hydration.  Imaging as below. Will communicate results to patient once available. Will await results to determine next steps.  -     MM 3D DIAGNOSTIC MAMMOGRAM BILATERAL BREAST -     US BREAST COMPLETE UNI LEFT INC AXILLA -     US BREAST COMPLETE UNI RIGHT INC AXILLA  Mass of lower outer  quadrant of left breast -     MM 3D DIAGNOSTIC MAMMOGRAM BILATERAL BREAST -     US BREAST COMPLETE UNI LEFT INC AXILLA -     US BREAST COMPLETE UNI RIGHT INC AXILLA  Dense breast tissue -     MM 3D DIAGNOSTIC MAMMOGRAM BILATERAL BREAST -     US BREAST COMPLETE UNI LEFT INC AXILLA -     US BREAST COMPLETE UNI RIGHT INC AXILLA  Abscess Discussed with patient at home management with warm compresses, expression, and follow up as needed. Discussed red flag symptoms.   Encounter for screening for depression Pt screened positive for depression today. Denies intent to harm herself or others. Pt to notify provider if they would like to initiate treatment.    Continue all other maintenance medications.  Follow up plan: Return if symptoms worsen or fail to improve.   Continue healthy lifestyle choices, including diet (rich in fruits, vegetables, and lean proteins, and low in salt and simple carbohydrates) and exercise (at least 30 minutes of moderate physical activity daily).  Written and verbal instructions provided   The above assessment and management plan was discussed with the patient. The patient verbalized understanding of and has agreed to the management plan. Patient is aware to call the clinic if they develop any new symptoms or if symptoms persist or worsen. Patient is aware when to return to the clinic for a follow-up visit. Patient educated on when it is appropriate to go to the emergency department.   Neale Burly, DNP-FNP [facility] [address] [place], [place] [address] [phone]

## 2023-05-03 ENCOUNTER — Telehealth: Payer: Self-pay

## 2023-05-03 NOTE — Telephone Encounter (Signed)
Diagnostic mammogram with possible ultrasounds was ordered on 04/23/2023 by [facility] for cystic breast tissue and left breast pain. Orders was sent to [facility], when scheduling called to make the appointment patient declined. The following is the note placed on order by scheduler.   Called patient she does not want to do at this time. Said she would call back later does not have insurance. Darl Pikes

## 2023-05-04 NOTE — Telephone Encounter (Signed)
Called and discussed with patient - mailed assistance information to the patient

## 2023-05-04 NOTE — Telephone Encounter (Signed)
Does she qualify for the mammogram scholarship?  Can she get [facility] [employer] assistance app?

## 2023-06-23 ENCOUNTER — Encounter: Payer: Self-pay | Admitting: Family Medicine

## 2023-06-23 ENCOUNTER — Ambulatory Visit: Payer: Self-pay | Admitting: Family Medicine

## 2023-06-23 DIAGNOSIS — Z8249 Family history of ischemic heart disease and other diseases of the circulatory system: Secondary | ICD-10-CM

## 2023-06-23 DIAGNOSIS — R9431 Abnormal electrocardiogram [ECG] [EKG]: Secondary | ICD-10-CM

## 2023-06-24 ENCOUNTER — Telehealth: Payer: Self-pay

## 2023-06-24 NOTE — Telephone Encounter (Signed)
Telephoned patient at mobile number. Left a voice message with [employer] (scholarship) contact information. 

## 2023-06-25 NOTE — Telephone Encounter (Signed)
Patient returned call and stated she has private insurance effective June 09, 2023. Patient ineligible for mammogram scholarship fund.

## 2023-07-16 ENCOUNTER — Telehealth: Payer: Self-pay | Admitting: Emergency Medicine

## 2023-07-16 DIAGNOSIS — H6690 Otitis media, unspecified, unspecified ear: Secondary | ICD-10-CM

## 2023-07-16 MED ORDER — AMOXICILLIN-POT CLAVULANATE 875-125 MG PO TABS: 1.0000 | ORAL_TABLET | Freq: Two times a day (BID) | ORAL | 0 refills | Status: DC

## 2023-07-16 NOTE — Progress Notes (Signed)
 Virtual Visit Consent   Sherri Patel, you are scheduled for a virtual visit with a [facility] provider today. Just as with appointments in the office, your consent must be obtained to participate. Your consent will be active for this visit and any virtual visit you may have with one of our providers in the next 365 days. If you have a MyChart account, a copy of this consent can be sent to you electronically.  As this is a virtual visit, video technology does not allow for your provider to perform a traditional examination. This may limit your provider's ability to fully assess your condition. If your provider identifies any concerns that need to be evaluated in person or the need to arrange testing (such as labs, EKG, etc.), we will make arrangements to do so. Although advances in technology are sophisticated, we cannot ensure that it will always work on either your end or our end. If the connection with a video visit is poor, the visit may have to be switched to a telephone visit. With either a video or telephone visit, we are not always able to ensure that we have a secure connection.  By engaging in this virtual visit, you consent to the provision of healthcare and authorize for your insurance to be billed (if applicable) for the services provided during this visit. Depending on your insurance coverage, you may receive a charge related to this service.  I need to obtain your verbal consent now. Are you willing to proceed with your visit today? [name] has provided verbal consent on 07/16/2023 for a virtual visit (video or telephone). [name], PA-C  Date: 07/16/2023 3:04 PM  Virtual Visit via Video Note   I, [name], connected with  Sherri Patel  ([id number], 1975-09-01) on 07/16/23 at  3:00 PM EST by a video-enabled telemedicine application and verified that I am speaking with the correct person using two identifiers.  Location: Patient: Virtual Visit Location  Patient: Home Provider: Virtual Visit Location Provider: Home Office   I discussed the limitations of evaluation and management by telemedicine and the availability of in person appointments. The patient expressed understanding and agreed to proceed.    History of Present Illness: Sherri Patel is a 48 y.o. who identifies as a female who was assigned female at birth, and is being seen today for sinus congestion and pressure.  States that she has ear aches on both sides.  Symptom onset was about 10 days ago.  She states that the ear pain started today.  States that she has had some cough and congestion as well.  Penicillin allergy is listed, but she has taken amoxicillin  in the past.  HPI: HPI  Problems:  Patient Active Problem List   Diagnosis Date Noted   Abscess 07/19/2020   Biliary dyskinesia    Vitamin B12 deficiency 02/28/2016   Vitamin D  deficiency 02/28/2016   Epilepsy (HCC) 03/28/2015   ASCUS favor benign 03/28/2015   Hypothyroidism 07/01/2010   Morbid obesity (HCC) 07/01/2010    Allergies:  Allergies  Allergen Reactions   Penicillins     Upset stomach Has patient had a PCN reaction causing immediate rash, facial/tongue/throat swelling, SOB or lightheadedness with hypotension: No Has patient had a PCN reaction causing severe rash involving mucus membranes or skin necrosis: No Has patient had a PCN reaction that required hospitalization: No Has patient had a PCN reaction occurring within the last 10 years: Yes If all of the above answers are  NO, then may proceed with Cephalosporin use.    Medications:  Current Outpatient Medications:    amoxicillin -clavulanate (AUGMENTIN ) 875-125 MG tablet, Take 1 tablet by mouth every 12 (twelve) hours., Disp: 14 tablet, Rfl: 0   acetaminophen  (TYLENOL ) 500 MG tablet, Take 1,000 mg by mouth daily as needed for moderate pain or headache., Disp: , Rfl:    azithromycin  (ZITHROMAX ) 250 MG tablet, Take 2 tablets on day 1, then 1 tablet  daily on days 2 through 5, Disp: 6 tablet, Rfl: 0   fluticasone  (FLONASE ) 50 MCG/ACT nasal spray, Place 2 sprays into both nostrils daily., Disp: 16 g, Rfl: 0   hydrOXYzine  (ATARAX /VISTARIL ) 50 MG tablet, Take 0.5-1 tablets (25-50 mg total) by mouth every 8 (eight) hours as needed for anxiety or itching., Disp: 90 tablet, Rfl: 0   ibuprofen (ADVIL,MOTRIN) 200 MG tablet, Take 400 mg by mouth daily as needed for headache or moderate pain., Disp: , Rfl:    lamoTRIgine  (LAMICTAL ) 150 MG tablet, Take 150 mg by mouth 2 (two) times daily. , Disp: , Rfl:    levETIRAcetam  (KEPPRA ) 750 MG tablet, Take 750 mg by mouth 2 (two) times daily., Disp: , Rfl:    levocetirizine (XYZAL ) 5 MG tablet, Take 1 tablet (5 mg total) by mouth every evening. For allergies, Disp: 90 tablet, Rfl: 3   levothyroxine  (SYNTHROID ) 150 MCG tablet, Take 1 tablet daily. Alternate with 175mcg every other day., Disp: , Rfl:    levothyroxine  (SYNTHROID ) 175 MCG tablet, Take 1 tablet (175 mcg total) by mouth daily. Alternate with 150mcg every other day., Disp: 90 tablet, Rfl: 3   valACYclovir  (VALTREX ) 1000 MG tablet, TAKE 2 TABLETS BY MOUTH  TWICE DAILY FOR 1 DAY PER  FLARE. REPEAT WITH EACH  FLAREUP, Disp: 20 tablet, Rfl: 1   zolmitriptan  (ZOMIG ) 5 MG tablet, Take 1 tablet (5 mg total) by mouth as needed for migraine., Disp: 10 tablet, Rfl: PRN  Observations/Objective: Patient is well-developed, well-nourished in no acute distress.  Resting comfortably  at home.  Head is normocephalic, atraumatic.  No labored breathing.  Speech is clear and coherent with logical content.  Patient is alert and oriented at baseline.    Assessment and Plan: 1. Acute otitis media, unspecified otitis media type (Primary)   Meds ordered this encounter  Medications   amoxicillin -clavulanate (AUGMENTIN ) 875-125 MG tablet    Sig: Take 1 tablet by mouth every 12 (twelve) hours.    Dispense:  14 tablet    Refill:  0    Supervising Provider:   BLAISE ALEENE KIDD [[id number]]     Follow Up Instructions: I discussed the assessment and treatment plan with the patient. The patient was provided an opportunity to ask questions and all were answered. The patient agreed with the plan and demonstrated an understanding of the instructions.  A copy of instructions were sent to the patient via MyChart unless otherwise noted below.     The patient was advised to call back or seek an in-person evaluation if the symptoms worsen or if the condition fails to improve as anticipated.    [name], PA-C

## 2023-07-16 NOTE — Patient Instructions (Signed)
 Tully JAYSON Lewis, thank you for joining [name], PA-C for today's virtual visit.  While this provider is not your primary care provider (PCP), if your PCP is located in our provider database this encounter information will be shared with them immediately following your visit.   A [facility] MyChart account gives you access to today's visit and all your visits, tests, and labs performed at [facility]  click here if you don't have a [facility] MyChart account or go to [email or web address]  Consent: (Patient) Sherri Patel provided verbal consent for this virtual visit at the beginning of the encounter.  Current Medications:  Current Outpatient Medications:    amoxicillin -clavulanate (AUGMENTIN ) 875-125 MG tablet, Take 1 tablet by mouth every 12 (twelve) hours., Disp: 14 tablet, Rfl: 0   acetaminophen  (TYLENOL ) 500 MG tablet, Take 1,000 mg by mouth daily as needed for moderate pain or headache., Disp: , Rfl:    azithromycin  (ZITHROMAX ) 250 MG tablet, Take 2 tablets on day 1, then 1 tablet daily on days 2 through 5, Disp: 6 tablet, Rfl: 0   fluticasone  (FLONASE ) 50 MCG/ACT nasal spray, Place 2 sprays into both nostrils daily., Disp: 16 g, Rfl: 0   hydrOXYzine  (ATARAX /VISTARIL ) 50 MG tablet, Take 0.5-1 tablets (25-50 mg total) by mouth every 8 (eight) hours as needed for anxiety or itching., Disp: 90 tablet, Rfl: 0   ibuprofen (ADVIL,MOTRIN) 200 MG tablet, Take 400 mg by mouth daily as needed for headache or moderate pain., Disp: , Rfl:    lamoTRIgine  (LAMICTAL ) 150 MG tablet, Take 150 mg by mouth 2 (two) times daily. , Disp: , Rfl:    levETIRAcetam  (KEPPRA ) 750 MG tablet, Take 750 mg by mouth 2 (two) times daily., Disp: , Rfl:    levocetirizine (XYZAL ) 5 MG tablet, Take 1 tablet (5 mg total) by mouth every evening. For allergies, Disp: 90 tablet, Rfl: 3   levothyroxine  (SYNTHROID ) 150 MCG tablet, Take 1 tablet daily. Alternate with 175mcg every other day., Disp: ,  Rfl:    levothyroxine  (SYNTHROID ) 175 MCG tablet, Take 1 tablet (175 mcg total) by mouth daily. Alternate with 150mcg every other day., Disp: 90 tablet, Rfl: 3   valACYclovir  (VALTREX ) 1000 MG tablet, TAKE 2 TABLETS BY MOUTH  TWICE DAILY FOR 1 DAY PER  FLARE. REPEAT WITH EACH  FLAREUP, Disp: 20 tablet, Rfl: 1   zolmitriptan  (ZOMIG ) 5 MG tablet, Take 1 tablet (5 mg total) by mouth as needed for migraine., Disp: 10 tablet, Rfl: PRN   Medications ordered in this encounter:  Meds ordered this encounter  Medications   amoxicillin -clavulanate (AUGMENTIN ) 875-125 MG tablet    Sig: Take 1 tablet by mouth every 12 (twelve) hours.    Dispense:  14 tablet    Refill:  0    Supervising Provider:   BLAISE ALEENE KIDD [[id number]]     *If you need refills on other medications prior to your next appointment, please contact your pharmacy*  Follow-Up: Call back or seek an in-person evaluation if the symptoms worsen or if the condition fails to improve as anticipated.  [facility] [phone]  Other Instructions   If you have been instructed to have an in-person evaluation today at a local Urgent Care facility, please use the link below. It will take you to a list of all of our available [facility], including address, phone number and hours of operation. Please do not delay care.   Urgent Cares  If you or  a family member do not have a primary care provider, use the link below to schedule a visit and establish care. When you choose a [facility] primary care physician or advanced practice provider, you gain a long-term partner in health. Find a Primary Care Provider  Learn more about [facility]'s in-office and virtual care options: [facility] - Get Care Now

## 2023-07-24 ENCOUNTER — Encounter: Payer: Self-pay | Admitting: Family Medicine

## 2023-08-04 ENCOUNTER — Other Ambulatory Visit: Payer: Self-pay | Admitting: Family Medicine

## 2023-08-04 DIAGNOSIS — B001 Herpesviral vesicular dermatitis: Secondary | ICD-10-CM

## 2023-08-11 ENCOUNTER — Other Ambulatory Visit: Payer: Self-pay | Admitting: Family Medicine

## 2023-08-11 DIAGNOSIS — E034 Atrophy of thyroid (acquired): Secondary | ICD-10-CM

## 2023-08-13 NOTE — Telephone Encounter (Signed)
 She alternates doses

## 2023-08-23 ENCOUNTER — Ambulatory Visit: Payer: Self-pay | Admitting: Internal Medicine

## 2023-08-24 ENCOUNTER — Encounter: Payer: Self-pay | Admitting: Family Medicine

## 2023-09-02 ENCOUNTER — Encounter: Payer: Self-pay | Admitting: Family Medicine

## 2023-10-02 ENCOUNTER — Other Ambulatory Visit: Payer: Self-pay | Admitting: Family Medicine

## 2023-10-02 DIAGNOSIS — E034 Atrophy of thyroid (acquired): Secondary | ICD-10-CM

## 2024-01-31 ENCOUNTER — Encounter (INDEPENDENT_AMBULATORY_CARE_PROVIDER_SITE_OTHER): Payer: Self-pay | Admitting: Family Medicine

## 2024-01-31 DIAGNOSIS — N939 Abnormal uterine and vaginal bleeding, unspecified: Secondary | ICD-10-CM

## 2024-01-31 MED ORDER — MEDROXYPROGESTERONE ACETATE 150 MG/ML IM SUSP: 150.0000 mg | INTRAMUSCULAR | 3 refills | Status: AC

## 2024-01-31 NOTE — Telephone Encounter (Signed)

## 2024-03-17 ENCOUNTER — Ambulatory Visit (INDEPENDENT_AMBULATORY_CARE_PROVIDER_SITE_OTHER): Payer: Self-pay

## 2024-03-17 ENCOUNTER — Encounter: Payer: Self-pay | Admitting: Family

## 2024-03-17 ENCOUNTER — Ambulatory Visit: Payer: Self-pay | Admitting: Family

## 2024-03-17 ENCOUNTER — Ambulatory Visit (INDEPENDENT_AMBULATORY_CARE_PROVIDER_SITE_OTHER): Payer: Self-pay | Admitting: Family

## 2024-03-17 VITALS — BP 115/79 | HR 82 | Temp 97.9°F | Ht 64.0 in | Wt 248.0 lb

## 2024-03-17 DIAGNOSIS — K6289 Other specified diseases of anus and rectum: Secondary | ICD-10-CM

## 2024-03-17 DIAGNOSIS — R3989 Other symptoms and signs involving the genitourinary system: Secondary | ICD-10-CM

## 2024-03-17 DIAGNOSIS — E034 Atrophy of thyroid (acquired): Secondary | ICD-10-CM

## 2024-03-17 DIAGNOSIS — R1032 Left lower quadrant pain: Secondary | ICD-10-CM

## 2024-03-17 DIAGNOSIS — R197 Diarrhea, unspecified: Secondary | ICD-10-CM

## 2024-03-17 LAB — URINALYSIS, COMPLETE
Bilirubin, UA: NEGATIVE
Glucose, UA: NEGATIVE
Ketones, UA: NEGATIVE
Nitrite, UA: NEGATIVE
Protein,UA: NEGATIVE
Specific Gravity, UA: 1.02 (ref 1.005–1.030)
Urobilinogen, Ur: 0.2 mg/dL (ref 0.2–1.0)
pH, UA: 6 (ref 5.0–7.5)

## 2024-03-17 LAB — MICROSCOPIC EXAMINATION
Renal Epithel, UA: NONE SEEN /HPF
Yeast, UA: NONE SEEN

## 2024-03-17 NOTE — Progress Notes (Signed)
 Subjective:    Patient ID: Sherri Patel, female    DOB: [birth year], 48 y.o.   MRN: [id number]  Chief Complaint  Patient presents with   Abdominal Pain   Back Pain   PT presents to the office today with abdominal pain for the last 4 days  with diarrhea for months. Reports her pain in her left lower abdomen will move to her back.   Complaining of intermittent pain in her rectum that hurts when sitting. Reports she has had a hemorrhoids before and this pain is different. Denies any pain with BM or blood in her stool.  Abdominal Pain This is a new problem. The current episode started in the past 7 days. The problem occurs intermittently. The problem has been waxing and waning. The pain is located in the LLQ. The pain is at a severity of 6/10. The pain is mild. The quality of the pain is burning. Associated symptoms include diarrhea. Pertinent negatives include no belching, constipation, flatus, hematochezia, nausea or vomiting.  Thyroid  Problem Presents for follow-up visit. Symptoms include diarrhea and fatigue. Patient reports no constipation.      Review of Systems  Constitutional:  Positive for fatigue.  Gastrointestinal:  Positive for abdominal pain and diarrhea. Negative for constipation, flatus, hematochezia, nausea and vomiting.  All other systems reviewed and are negative.   Social History   Socioeconomic History   Marital status: Married    Spouse name: Not on file   Number of children: 3   Years of education: Not on file   Highest education level: Not on file  Occupational History    Employer: UNEMPLOYED  Tobacco Use   Smoking status: Never   Smokeless tobacco: Never  Vaping Use   Vaping status: Never Used  Substance and Sexual Activity   Alcohol use: No   Drug use: No   Sexual activity: Yes    Birth control/protection: Injection  Other Topics Concern   Not on file  Social History Narrative   Not on file   Social Drivers of Health   Financial Resource  Strain: Not on file  Food Insecurity: Not on file  Transportation Needs: Not on file  Physical Activity: Not on file  Stress: Not on file  Social Connections: Not on file   Family History  Problem Relation Age of Onset   COPD Mother    Cancer Mother    Diabetes Mother    Depression Mother    Bipolar disorder Mother    COPD Father    Cancer Father        prostate   Hypertension Father         Objective:   Physical Exam Vitals reviewed.  Constitutional:      General: She is not in acute distress.    Appearance: She is well-developed.  HENT:     Head: Normocephalic and atraumatic.     Right Ear: Tympanic membrane normal.     Left Ear: Tympanic membrane normal.  Eyes:     Pupils: Pupils are equal, round, and reactive to light.  Neck:     Thyroid : No thyromegaly.  Cardiovascular:     Rate and Rhythm: Normal rate and regular rhythm.     Heart sounds: Normal heart sounds. No murmur heard. Pulmonary:     Effort: Pulmonary effort is normal. No respiratory distress.     Breath sounds: Normal breath sounds. No wheezing.  Abdominal:     General: Bowel sounds are normal. There is no  distension.     Palpations: Abdomen is soft.     Tenderness: There is no abdominal tenderness (no tenderness on exam).  Musculoskeletal:        General: No tenderness. Normal range of motion.     Cervical back: Normal range of motion and neck supple.  Skin:    General: Skin is warm and dry.  Neurological:     Mental Status: She is alert and oriented to person, place, and time.     Cranial Nerves: No cranial nerve deficit.     Deep Tendon Reflexes: Reflexes are normal and symmetric.  Psychiatric:        Behavior: Behavior normal.        Thought Content: Thought content normal.        Judgment: Judgment normal.       BP 115/79   Pulse 82   Temp 97.9 F (36.6 C) (Temporal)   Ht 5' 4 (1.626 m)   Wt 248 lb (112.5 kg)   BMI 42.57 kg/m      Assessment & Plan:  Sherri Patel comes  in today with chief complaint of Abdominal Pain and Back Pain   Diagnosis and orders addressed:  1. Left lower quadrant abdominal pain (Primary) - DG Abd 1 View; Future - Urinalysis, Complete - CMP14+EGFR - CBC with Differential/Platelet  2. Diarrhea, unspecified type - CMP14+EGFR - CBC with Differential/Platelet  3. Rectum pain - CMP14+EGFR - CBC with Differential/Platelet  4. Hypothyroidism due to acquired atrophy of thyroid   - TSH   Labs pending Could be rectum spasm? If KUB, labs, and urine negative we could try muscle relaxer to see if this helps. She states she is allergic to imodium, so recommend increasing her fiber to bulk her stool to help with diarrhea.  If these symptoms continue will need referral to GI Keep follow up with PCP   Bari Learn, FNP

## 2024-03-17 NOTE — Addendum Note (Signed)
 Addended by: LAVELL LYE A on: 03/17/2024 03:41 PM   Modules accepted: Orders

## 2024-03-17 NOTE — Patient Instructions (Signed)
 Abdominal Pain, Adult  Pain in the abdomen (abdominal pain) can be caused by many things. In most cases, it gets better with no treatment or by being treated at home. But in some cases, it can be serious. Your health care provider will ask questions about your medical history and do a physical exam to try to figure out what is causing your pain. Follow these instructions at home: Medicines Take over-the-counter and prescription medicines only as told by your provider. Do not take medicines that help you poop (laxatives) unless told by your provider. General instructions Watch your condition for any changes. Drink enough fluid to keep your pee (urine) pale yellow. Contact a health care provider if: Your pain changes, gets worse, or lasts longer than expected. You have severe cramping or bloating in your abdomen, or you vomit. Your pain gets worse with meals, after eating, or with certain foods. You are constipated or have diarrhea for more than 2-3 days. You are not hungry, or you lose weight without trying. You have signs of dehydration. These may include: Dark pee, very little pee, or no pee. Cracked lips or dry mouth. Sleepiness or weakness. You have pain when you pee (urinate) or poop. Your abdominal pain wakes you up at night. You have blood in your pee. You have a fever. Get help right away if: You cannot stop vomiting. Your pain is only in one part of the abdomen. Pain on the right side could be caused by appendicitis. You have bloody or black poop (stool), or poop that looks like tar. You have trouble breathing. You have chest pain. These symptoms may be an emergency. Get help right away. Call 911. Do not wait to see if the symptoms will go away. Do not drive yourself to the hospital. This information is not intended to replace advice given to you by your health care provider. Make sure you discuss any questions you have with your health care provider. Document Revised:  03/11/2022 Document Reviewed: 03/11/2022 Elsevier Patient Education  2024 [employer].

## 2024-03-18 LAB — CBC WITH DIFFERENTIAL/PLATELET
Basophils Absolute: 0 x10E3/uL (ref 0.0–0.2)
Basos: 1 %
EOS (ABSOLUTE): 0 x10E3/uL (ref 0.0–0.4)
Eos: 0 %
Hematocrit: 41.1 % (ref 34.0–46.6)
Hemoglobin: 12.8 g/dL (ref 11.1–15.9)
Immature Grans (Abs): 0.1 x10E3/uL (ref 0.0–0.1)
Immature Granulocytes: 1 %
Lymphocytes Absolute: 2.3 x10E3/uL (ref 0.7–3.1)
Lymphs: 30 %
MCH: 27.1 pg (ref 26.6–33.0)
MCHC: 31.1 g/dL — ABNORMAL LOW (ref 31.5–35.7)
MCV: 87 fL (ref 79–97)
Monocytes Absolute: 0.7 x10E3/uL (ref 0.1–0.9)
Monocytes: 9 %
Neutrophils Absolute: 4.4 x10E3/uL (ref 1.4–7.0)
Neutrophils: 59 %
Platelets: 374 x10E3/uL (ref 150–450)
RBC: 4.73 x10E6/uL (ref 3.77–5.28)
RDW: 11.7 % (ref 11.7–15.4)
WBC: 7.4 x10E3/uL (ref 3.4–10.8)

## 2024-03-18 LAB — CMP14+EGFR
ALT: 23 IU/L (ref 0–32)
AST: 23 IU/L (ref 0–40)
Albumin: 4 g/dL (ref 3.9–4.9)
Alkaline Phosphatase: 92 IU/L (ref 41–116)
BUN/Creatinine Ratio: 9 (ref 9–23)
BUN: 6 mg/dL (ref 6–24)
Bilirubin Total: 0.4 mg/dL (ref 0.0–1.2)
CO2: 19 mmol/L — ABNORMAL LOW (ref 20–29)
Calcium: 9.3 mg/dL (ref 8.7–10.2)
Chloride: 105 mmol/L (ref 96–106)
Creatinine, Ser: 0.7 mg/dL (ref 0.57–1.00)
Globulin, Total: 2.4 g/dL (ref 1.5–4.5)
Glucose: 98 mg/dL (ref 70–99)
Potassium: 4.1 mmol/L (ref 3.5–5.2)
Sodium: 139 mmol/L (ref 134–144)
Total Protein: 6.4 g/dL (ref 6.0–8.5)
eGFR: 107 mL/min/1.73 (ref >59)

## 2024-03-18 LAB — TSH: TSH: 0.076 u[IU]/mL — ABNORMAL LOW (ref 0.450–4.500)

## 2024-03-20 ENCOUNTER — Telehealth: Payer: Self-pay | Admitting: Family Medicine

## 2024-03-20 MED ORDER — NITROFURANTOIN MONOHYD MACRO 100 MG PO CAPS: 100.0000 mg | ORAL_CAPSULE | Freq: Two times a day (BID) | ORAL | 0 refills | Status: AC

## 2024-03-20 NOTE — Telephone Encounter (Signed)
 Message sent through mychart to see if PCP will look at labs since [facility] is not here.

## 2024-03-20 NOTE — Telephone Encounter (Signed)
 Copied from CRM [id number]. Topic: Clinical - Lab/Test Results >> Mar 20, 2024 11:59 AM Sherri Patel wrote: Reason for CRM: Pt called in wanting to get more information about her recent lab results. Informed pt that someone from clinic will give her a call back shortly.

## 2024-04-27 ENCOUNTER — Other Ambulatory Visit: Payer: Self-pay | Admitting: Medical Genetics

## 2024-05-03 ENCOUNTER — Inpatient Hospital Stay: Admission: RE | Admit: 2024-05-03 | Payer: Self-pay | Source: Ambulatory Visit

## 2024-05-17 ENCOUNTER — Encounter: Payer: Self-pay | Admitting: Family Medicine

## 2024-05-17 ENCOUNTER — Ambulatory Visit: Payer: Self-pay | Admitting: Family Medicine

## 2024-05-17 VITALS — BP 129/77 | HR 82 | Temp 98.8°F | Ht 64.0 in | Wt 240.0 lb

## 2024-05-17 DIAGNOSIS — E034 Atrophy of thyroid (acquired): Secondary | ICD-10-CM

## 2024-05-17 DIAGNOSIS — Z638 Other specified problems related to primary support group: Secondary | ICD-10-CM

## 2024-05-17 DIAGNOSIS — N644 Mastodynia: Secondary | ICD-10-CM

## 2024-05-17 DIAGNOSIS — R634 Abnormal weight loss: Secondary | ICD-10-CM

## 2024-05-17 MED ORDER — LEVOTHYROXINE SODIUM 150 MCG PO TABS: 150.0000 ug | ORAL_TABLET | Freq: Every day | ORAL | 4 refills | Status: DC

## 2024-05-17 NOTE — Progress Notes (Signed)
 Subjective: CC: Abdominal pain PCP: Jolinda Sherri HERO, DO YEP:Sherri Patel is a 48 y.o. female presenting to clinic today for:  Patient reports that she has been having some lower abdominal pain that she is attributing to a fibroid mass.  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  She reports when she lays in certain positions she is not able to urinate for a while.  She has had some unexpected weight loss over the last several months.  She attributes this to history of cholecystectomy back in 2018 but had stable weight prior to this.  She denies any blood in stool or change in bowel habits.  No colonoscopy history.  She does admit to some increase stressors surrounding one of her sons, Kallan, who has estranged himself from her due to some childhood trauma.  She also asked that we place new orders for mammogram to be done at the [facility] because she is uninsured and they allow her to do a payment plan.  She apparently had 1 ordered last November due to some concerning symptoms of lump in the left breast.  Since she has lost some weight she is noticing skin changes but denies any overt lump.  ROS: Per HPI  Allergies  Allergen Reactions   Penicillins     Upset stomach Has patient had a PCN reaction causing immediate rash, facial/tongue/throat swelling, SOB or lightheadedness with hypotension: No Has patient had a PCN reaction causing severe rash involving mucus membranes or skin necrosis: No Has patient had a PCN reaction that required hospitalization: No Has patient had a PCN reaction occurring within the last 10 years: Yes If all of the above answers are NO, then may proceed with Cephalosporin use.    Past Medical History:  Diagnosis Date   Allergy    Dyslipidemia    Epilepsy (HCC)    Hyperlipidemia    Hypothyroidism    Overweight(278.02)    Seizures (HCC)    SOB  (shortness of breath)    Stress     Current Outpatient Medications:    acetaminophen  (TYLENOL ) 500 MG tablet, Take 1,000 mg by mouth daily as needed for moderate pain or headache., Disp: , Rfl:    ibuprofen (ADVIL,MOTRIN) 200 MG tablet, Take 400 mg by mouth daily as needed for headache or moderate pain., Disp: , Rfl:    lamoTRIgine  (LAMICTAL ) 150 MG tablet, Take 150 mg by mouth 2 (two) times daily. , Disp: , Rfl:    levETIRAcetam  (KEPPRA ) 750 MG tablet, Take 750 mg by mouth 2 (two) times daily., Disp: , Rfl:    levothyroxine  (SYNTHROID ) 150 MCG tablet, Take 1 tablet (150 mcg total) by mouth daily. **NEEDS LABWORK**, Disp: 30 tablet, Rfl: 0   levothyroxine  (SYNTHROID ) 175 MCG tablet, Take 1 tablet (175 mcg total) by mouth daily. Alternate with 150mcg every other day., Disp: 90 tablet, Rfl: 3   medroxyPROGESTERone  (DEPO-PROVERA ) 150 MG/ML injection, Inject 1 mL (150 mg total) into the muscle every 3 (three) months. Stop OCP, Disp: 1 mL, Rfl: 3   valACYclovir  (VALTREX ) 1000 MG tablet, TAKE 2 TABLETS BY MOUTH TWICE  DAILY FOR 1 DAY PER FLARE.  REPEAT WITH EACH FLAREUP, Disp: 20 tablet, Rfl: 3   zolmitriptan  (ZOMIG ) 5 MG tablet, Take 1 tablet (5 mg total) by mouth as needed for migraine., Disp: 10 tablet, Rfl: PRN Social History   Socioeconomic History   Marital status: Married  Spouse name: Not on file   Number of children: 3   Years of education: Not on file   Highest education level: Not on file  Occupational History    Employer: UNEMPLOYED  Tobacco Use   Smoking status: Never   Smokeless tobacco: Never  Vaping Use   Vaping status: Never Used  Substance and Sexual Activity   Alcohol use: No   Drug use: No   Sexual activity: Yes    Birth control/protection: Injection  Other Topics Concern   Not on file  Social History Narrative   Not on file   Social Drivers of Health   Financial Resource Strain: Not on file  Food Insecurity: No Food Insecurity (03/23/2024)   Received from  [facility]   Hunger Vital Sign    Within the past 12 months, you worried that your food would run out before you got the money to buy more.: Never true    Within the past 12 months, the food you bought just didn't last and you didn't have money to get more.: Never true  Transportation Needs: No Transportation Needs (03/23/2024)   Received from [facility] - Transportation    Lack of Transportation (Medical): No    Lack of Transportation (Non-Medical): No  Physical Activity: Not on file  Stress: Not on file  Social Connections: Not on file  Intimate Partner Violence: Not on file   Family History  Problem Relation Age of Onset   COPD Mother    Cancer Mother    Diabetes Mother    Depression Mother    Bipolar disorder Mother    COPD Father    Cancer Father        prostate   Hypertension Father     Objective: Office vital signs reviewed. BP 129/77   Pulse 82   Temp 98.8 F (37.1 C)   Ht 5' 4 (1.626 m)   Wt 240 lb (108.9 kg)   PF 98 L/min   BMI 41.20 kg/m   Physical Examination:  General: Awake, alert, morbidly obese, No acute distress HEENT:sclera white, MMM.  No excess exophthalmos Cardio: regular rate and rhythm  Pulm:  normal work of breathing on room air Psych: Tearful.  Appears stressed  Assessment/ Plan: 48 y.o. female   Unexplained weight loss - Plan: TSH + free T4, CBC with Differential, Fecal occult blood, imunochemical  Stress due to family tension  Breast pain, left - Plan: MM 3D DIAGNOSTIC MAMMOGRAM BILATERAL BREAST, US  LIMITED ULTRASOUND INCLUDING AXILLA RIGHT BREAST, US  LIMITED ULTRASOUND INCLUDING AXILLA LEFT BREAST   Hypothyroidism due to acquired atrophy of thyroid  - Plan: levothyroxine  (SYNTHROID ) 150 MCG tablet   FOBT, repeat thyroid  levels given suppression of TSH.  Unexplained weight loss may be related to overtreatment of thyroid .  Currently only taking 150 mcg daily.  No longer alternating with 175 mcg.  Recheck CBC though  no abnormalities noted on recent lab draw of concern.  Discussed importance of FOBT given unplanned weight loss and pelvic pain.  She is down over 30 pounds since our visit in July 2024.  Offered [facility] referral but for now she wishes to defer this.  Does not wish for any medication at this time and hopes to work through this stress independently  Stat mammography of the breast ordered.  Ultrasound also ordered.  Further interventions pending results   Sherri CHRISTELLA Fielding, DO [facility] [phone]

## 2024-05-18 ENCOUNTER — Other Ambulatory Visit: Payer: Self-pay

## 2024-05-18 DIAGNOSIS — N6320 Unspecified lump in the left breast, unspecified quadrant: Secondary | ICD-10-CM

## 2024-05-18 DIAGNOSIS — N644 Mastodynia: Secondary | ICD-10-CM

## 2024-05-18 LAB — CBC WITH DIFFERENTIAL/PLATELET
Basophils Absolute: 0 x10E3/uL (ref 0.0–0.2)
Basos: 1 %
EOS (ABSOLUTE): 0 x10E3/uL (ref 0.0–0.4)
Eos: 0 %
Hematocrit: 39 % (ref 34.0–46.6)
Hemoglobin: 12.5 g/dL (ref 11.1–15.9)
Immature Grans (Abs): 0.1 x10E3/uL (ref 0.0–0.1)
Immature Granulocytes: 1 %
Lymphocytes Absolute: 3.2 x10E3/uL — ABNORMAL HIGH (ref 0.7–3.1)
Lymphs: 38 %
MCH: 26.5 pg — ABNORMAL LOW (ref 26.6–33.0)
MCHC: 32.1 g/dL (ref 31.5–35.7)
MCV: 83 fL (ref 79–97)
Monocytes Absolute: 0.8 x10E3/uL (ref 0.1–0.9)
Monocytes: 9 %
Neutrophils Absolute: 4.4 x10E3/uL (ref 1.4–7.0)
Neutrophils: 51 %
Platelets: 427 x10E3/uL (ref 150–450)
RBC: 4.72 x10E6/uL (ref 3.77–5.28)
RDW: 12.2 % (ref 11.7–15.4)
WBC: 8.5 x10E3/uL (ref 3.4–10.8)

## 2024-05-18 LAB — TSH+FREE T4
Free T4: 1.65 ng/dL (ref 0.82–1.77)
TSH: 0.073 u[IU]/mL — ABNORMAL LOW (ref 0.450–4.500)

## 2024-05-19 ENCOUNTER — Ambulatory Visit: Payer: Self-pay | Admitting: Family Medicine

## 2024-05-19 DIAGNOSIS — E034 Atrophy of thyroid (acquired): Secondary | ICD-10-CM

## 2024-05-19 MED ORDER — LEVOTHYROXINE SODIUM 137 MCG PO TABS: 137.0000 ug | ORAL_TABLET | Freq: Every day | ORAL | 0 refills | Status: AC

## 2024-05-26 ENCOUNTER — Other Ambulatory Visit: Payer: Self-pay | Admitting: Family Medicine

## 2024-05-29 LAB — HM MAMMOGRAPHY

## 2024-05-30 ENCOUNTER — Encounter: Payer: Self-pay | Admitting: Family Medicine

## 2024-05-30 LAB — FECAL OCCULT BLOOD, IMMUNOCHEMICAL: Fecal Occult Bld: NEGATIVE

## 2024-06-02 ENCOUNTER — Ambulatory Visit: Payer: Self-pay | Admitting: Family Medicine

## 2024-06-22 ENCOUNTER — Ambulatory Visit: Payer: Self-pay

## 2024-06-22 ENCOUNTER — Other Ambulatory Visit: Payer: Self-pay

## 2024-06-28 ENCOUNTER — Other Ambulatory Visit: Payer: Self-pay | Admitting: Medical Genetics

## 2024-06-28 DIAGNOSIS — Z006 Encounter for examination for normal comparison and control in clinical research program: Secondary | ICD-10-CM

## 2024-09-15 ENCOUNTER — Ambulatory Visit: Payer: Self-pay | Admitting: Family Medicine
